# Patient Record
Sex: Male | Born: 1955 | Race: Black or African American | Hispanic: No | Marital: Single | Smoking: Former smoker
Health system: Southern US, Community
[De-identification: ages and names within clinical notes are randomized; demographics above are authoritative.]

## PROBLEM LIST (undated history)

## (undated) DIAGNOSIS — I10 Essential (primary) hypertension: Secondary | ICD-10-CM

## (undated) DIAGNOSIS — I219 Acute myocardial infarction, unspecified: Secondary | ICD-10-CM

## (undated) DIAGNOSIS — E785 Hyperlipidemia, unspecified: Secondary | ICD-10-CM

## (undated) HISTORY — PX: CORONARY ANGIOPLASTY WITH STENT PLACEMENT: SHX49

## (undated) HISTORY — PX: FEMORAL ARTERY STENT: SHX1583

---

## 2019-12-21 ENCOUNTER — Ambulatory Visit: Payer: Self-pay | Attending: Internal Medicine

## 2019-12-21 DIAGNOSIS — Z23 Encounter for immunization: Secondary | ICD-10-CM

## 2019-12-21 NOTE — Progress Notes (Signed)
   Covid-19 Vaccination Clinic  Name:  Craig Potter    MRN: [id number] DOB: [birth year]  12/21/2019  [name] was observed post Covid-19 immunization for 15 minutes without incident. He was provided with Vaccine Information Sheet and instruction to access the V-Safe system.   [name] was instructed to call 911 with any severe reactions post vaccine: [name] Difficulty breathing  . Swelling of face and throat  . A fast heartbeat  . A bad rash all over body  . Dizziness and weakness   Immunizations Administered    Name Date Dose VIS Date Route   Pfizer COVID-19 Vaccine 12/21/2019  4:37 PM 0.3 mL 09/14/2019 Intramuscular   Manufacturer: [employer], [employer]   Lot: [id number]   NDC: [address]-6

## 2020-01-11 ENCOUNTER — Ambulatory Visit: Payer: Self-pay | Attending: Internal Medicine

## 2020-01-11 DIAGNOSIS — Z23 Encounter for immunization: Secondary | ICD-10-CM

## 2020-01-11 NOTE — Progress Notes (Signed)
   Covid-19 Vaccination Clinic  Name:  Kenny Rea    MRN: [id number] DOB: 16-Oct-1955  01/11/2020  Mr. Babel was observed post Covid-19 immunization for 15 minutes without incident. He was provided with Vaccine Information Sheet and instruction to access the V-Safe system.   Mr. Grape was instructed to call 911 with any severe reactions post vaccine: [name] Difficulty breathing  . Swelling of face and throat  . A fast heartbeat  . A bad rash all over body  . Dizziness and weakness   Immunizations Administered    Name Date Dose VIS Date Route   Pfizer COVID-19 Vaccine 01/11/2020  3:01 PM 0.3 mL 09/14/2019 Intramuscular   Manufacturer: [employer], [employer]   Lot: [id number]   NDC: [address]-3

## 2021-03-11 DIAGNOSIS — I739 Peripheral vascular disease, unspecified: Secondary | ICD-10-CM | POA: Diagnosis not present

## 2021-03-11 DIAGNOSIS — Z1211 Encounter for screening for malignant neoplasm of colon: Secondary | ICD-10-CM | POA: Diagnosis not present

## 2021-04-22 DIAGNOSIS — I10 Essential (primary) hypertension: Secondary | ICD-10-CM | POA: Diagnosis not present

## 2021-04-22 DIAGNOSIS — F418 Other specified anxiety disorders: Secondary | ICD-10-CM | POA: Diagnosis not present

## 2021-04-22 DIAGNOSIS — E785 Hyperlipidemia, unspecified: Secondary | ICD-10-CM | POA: Diagnosis not present

## 2021-04-22 DIAGNOSIS — I2510 Atherosclerotic heart disease of native coronary artery without angina pectoris: Secondary | ICD-10-CM | POA: Diagnosis not present

## 2021-04-22 DIAGNOSIS — D751 Secondary polycythemia: Secondary | ICD-10-CM | POA: Diagnosis not present

## 2021-04-22 DIAGNOSIS — M542 Cervicalgia: Secondary | ICD-10-CM | POA: Diagnosis not present

## 2021-04-22 DIAGNOSIS — F17210 Nicotine dependence, cigarettes, uncomplicated: Secondary | ICD-10-CM | POA: Diagnosis not present

## 2021-04-28 DIAGNOSIS — D751 Secondary polycythemia: Secondary | ICD-10-CM | POA: Diagnosis not present

## 2021-04-28 DIAGNOSIS — E785 Hyperlipidemia, unspecified: Secondary | ICD-10-CM | POA: Diagnosis not present

## 2021-05-06 DIAGNOSIS — M542 Cervicalgia: Secondary | ICD-10-CM | POA: Diagnosis not present

## 2021-05-06 DIAGNOSIS — D751 Secondary polycythemia: Secondary | ICD-10-CM | POA: Diagnosis not present

## 2021-05-06 DIAGNOSIS — F17200 Nicotine dependence, unspecified, uncomplicated: Secondary | ICD-10-CM | POA: Diagnosis not present

## 2021-05-06 DIAGNOSIS — E785 Hyperlipidemia, unspecified: Secondary | ICD-10-CM | POA: Diagnosis not present

## 2021-05-06 DIAGNOSIS — I2510 Atherosclerotic heart disease of native coronary artery without angina pectoris: Secondary | ICD-10-CM | POA: Diagnosis not present

## 2021-05-06 DIAGNOSIS — I10 Essential (primary) hypertension: Secondary | ICD-10-CM | POA: Diagnosis not present

## 2021-05-06 DIAGNOSIS — F418 Other specified anxiety disorders: Secondary | ICD-10-CM | POA: Diagnosis not present

## 2021-06-09 ENCOUNTER — Encounter: Payer: Self-pay | Admitting: Internal Medicine

## 2021-06-10 ENCOUNTER — Ambulatory Visit: Admission: RE | Admit: 2021-06-10 | Payer: Medicare PPO | Source: Home / Self Care | Admitting: Internal Medicine

## 2021-06-10 ENCOUNTER — Encounter: Admission: RE | Payer: Self-pay | Source: Home / Self Care

## 2021-06-10 HISTORY — DX: Hyperlipidemia, unspecified: E78.5

## 2021-06-10 HISTORY — DX: Acute myocardial infarction, unspecified: I21.9

## 2021-06-10 HISTORY — DX: Essential (primary) hypertension: I10

## 2021-06-10 SURGERY — COLONOSCOPY WITH PROPOFOL
Anesthesia: General

## 2021-08-04 DIAGNOSIS — E785 Hyperlipidemia, unspecified: Secondary | ICD-10-CM | POA: Diagnosis not present

## 2021-08-07 ENCOUNTER — Other Ambulatory Visit: Payer: Self-pay | Admitting: Internal Medicine

## 2021-08-07 ENCOUNTER — Ambulatory Visit
Admission: RE | Admit: 2021-08-07 | Discharge: 2021-08-07 | Disposition: A | Discharge status: Home / Self Care | Payer: Medicare PPO | Attending: Internal Medicine | Admitting: Internal Medicine

## 2021-08-07 ENCOUNTER — Ambulatory Visit
Admission: RE | Admit: 2021-08-07 | Discharge: 2021-08-07 | Disposition: A | Discharge status: Home / Self Care | Payer: Medicare PPO | Source: Ambulatory Visit | Attending: Internal Medicine | Admitting: Internal Medicine

## 2021-08-07 DIAGNOSIS — F17200 Nicotine dependence, unspecified, uncomplicated: Secondary | ICD-10-CM | POA: Diagnosis not present

## 2021-08-07 DIAGNOSIS — E785 Hyperlipidemia, unspecified: Secondary | ICD-10-CM | POA: Diagnosis not present

## 2021-08-07 DIAGNOSIS — M5410 Radiculopathy, site unspecified: Secondary | ICD-10-CM | POA: Insufficient documentation

## 2021-08-07 DIAGNOSIS — M545 Low back pain, unspecified: Secondary | ICD-10-CM | POA: Diagnosis not present

## 2021-08-07 DIAGNOSIS — I10 Essential (primary) hypertension: Secondary | ICD-10-CM | POA: Diagnosis not present

## 2021-08-07 DIAGNOSIS — Z1331 Encounter for screening for depression: Secondary | ICD-10-CM | POA: Diagnosis not present

## 2021-08-07 DIAGNOSIS — F418 Other specified anxiety disorders: Secondary | ICD-10-CM | POA: Diagnosis not present

## 2021-08-07 DIAGNOSIS — R52 Pain, unspecified: Secondary | ICD-10-CM

## 2021-08-07 DIAGNOSIS — D751 Secondary polycythemia: Secondary | ICD-10-CM | POA: Diagnosis not present

## 2021-08-07 DIAGNOSIS — S335XXA Sprain of ligaments of lumbar spine, initial encounter: Secondary | ICD-10-CM | POA: Diagnosis not present

## 2021-08-07 DIAGNOSIS — M542 Cervicalgia: Secondary | ICD-10-CM | POA: Diagnosis not present

## 2021-08-07 DIAGNOSIS — I2510 Atherosclerotic heart disease of native coronary artery without angina pectoris: Secondary | ICD-10-CM | POA: Diagnosis not present

## 2021-09-07 DIAGNOSIS — I10 Essential (primary) hypertension: Secondary | ICD-10-CM | POA: Diagnosis not present

## 2021-09-07 DIAGNOSIS — Z0001 Encounter for general adult medical examination with abnormal findings: Secondary | ICD-10-CM | POA: Diagnosis not present

## 2021-09-08 DIAGNOSIS — B370 Candidal stomatitis: Secondary | ICD-10-CM | POA: Diagnosis not present

## 2021-09-08 DIAGNOSIS — M542 Cervicalgia: Secondary | ICD-10-CM | POA: Diagnosis not present

## 2021-09-08 DIAGNOSIS — Z0001 Encounter for general adult medical examination with abnormal findings: Secondary | ICD-10-CM | POA: Diagnosis not present

## 2021-09-08 DIAGNOSIS — I2510 Atherosclerotic heart disease of native coronary artery without angina pectoris: Secondary | ICD-10-CM | POA: Diagnosis not present

## 2021-09-08 DIAGNOSIS — E785 Hyperlipidemia, unspecified: Secondary | ICD-10-CM | POA: Diagnosis not present

## 2021-09-08 DIAGNOSIS — F17200 Nicotine dependence, unspecified, uncomplicated: Secondary | ICD-10-CM | POA: Diagnosis not present

## 2021-09-08 DIAGNOSIS — F418 Other specified anxiety disorders: Secondary | ICD-10-CM | POA: Diagnosis not present

## 2021-09-08 DIAGNOSIS — S335XXA Sprain of ligaments of lumbar spine, initial encounter: Secondary | ICD-10-CM | POA: Diagnosis not present

## 2021-09-08 DIAGNOSIS — I10 Essential (primary) hypertension: Secondary | ICD-10-CM | POA: Diagnosis not present

## 2021-09-08 DIAGNOSIS — D751 Secondary polycythemia: Secondary | ICD-10-CM | POA: Diagnosis not present

## 2021-09-18 DIAGNOSIS — Z23 Encounter for immunization: Secondary | ICD-10-CM | POA: Diagnosis not present

## 2021-10-07 ENCOUNTER — Other Ambulatory Visit: Payer: Self-pay

## 2021-10-07 DIAGNOSIS — F17210 Nicotine dependence, cigarettes, uncomplicated: Secondary | ICD-10-CM

## 2021-10-07 DIAGNOSIS — Z87891 Personal history of nicotine dependence: Secondary | ICD-10-CM

## 2021-10-19 NOTE — Progress Notes (Addendum)
Virtual Visit via Telephone Note  I connected with Craig Potter on 10/20/21 at 10:30 AM EST by telephone and verified that I am speaking with the correct person using two identifiers.  Location: Patient: Home Provider: Home   I discussed the limitations, risks, security and privacy concerns of performing an evaluation and management service by telephone and the availability of in person appointments. I also discussed with the patient that there may be a patient responsible charge related to this service. The patient expressed understanding and agreed to proceed.  Glenford Bayley, NP   Shared Decision Making Visit Lung Cancer Screening Program [phone])   Eligibility: Age 66 y.o. Pack Years Smoking History Calculation 46 (# packs/per year x # years smoked) Recent History of coughing up blood  no Unexplained weight loss? no ( >Than 15 pounds within the last 6 months ) Prior History Lung / other cancer no (Diagnosis within the last 5 years already requiring surveillance chest CT Scans). Smoking Status Current Smoker Former Smokers: Years since quit: NA  Quit Date: NA  Visit Components: Discussion included one or more decision making aids. yes Discussion included risk/benefits of screening. yes Discussion included potential follow up diagnostic testing for abnormal scans. yes Discussion included meaning and risk of over diagnosis. yes Discussion included meaning and risk of False Positives. yes Discussion included meaning of total radiation exposure. yes  Counseling Included: Importance of adherence to annual lung cancer LDCT screening. yes Impact of comorbidities on ability to participate in the program. yes Ability and willingness to under diagnostic treatment. yes  Smoking Cessation Counseling: Current Smokers:  Discussed importance of smoking cessation. yes Information about tobacco cessation classes and interventions provided to patient. yes Patient provided with  "ticket" for LDCT Scan. NA Symptomatic Patient. no  Counseling(Intermediate counseling: > three minutes) 99406 Diagnosis Code: Tobacco Use Z72.0 Asymptomatic Patient yes  Counseling (Intermediate counseling: > three minutes counseling) D3220 Former Smokers:  Discussed the importance of maintaining cigarette abstinence. yes Diagnosis Code: Personal History of Nicotine Dependence. U54.270 Information about tobacco cessation classes and interventions provided to patient. Yes Patient provided with "ticket" for LDCT Scan. NA Written Order for Lung Cancer Screening with LDCT placed in Epic. Yes (CT Chest Lung Cancer Screening Low Dose W/O CM) [id number] Z12.2-Screening of respiratory organs Z87.891-Personal history of nicotine dependence  I have spent 25 minutes of face to face/ virtual visit   time with Craig Potter discussing the risks and benefits of lung cancer screening. We viewed / discussed a power point together that explained in detail the above noted topics. We paused at intervals to allow for questions to be asked and answered to ensure understanding.We discussed that the single most powerful action that he can take to decrease his risk of developing lung cancer is to quit smoking. We discussed whether or not he is ready to commit to setting a quit date. We discussed options for tools to aid in quitting smoking including nicotine replacement therapy, non-nicotine medications, support groups, Quit Smart classes, and behavior modification. We discussed that often times setting smaller, more achievable goals, such as eliminating 1 cigarette a day for a week and then 2 cigarettes a day for a week can be helpful in slowly decreasing the number of cigarettes smoked. This allows for a sense of accomplishment as well as providing a clinical benefit. I provided  him  with smoking cessation  information  with contact information for community resources, classes, free nicotine replacement therapy, and access to  mobile apps,  text messaging, and on-line smoking cessation help. I have also provided  him  the office contact information in the event he needs to contact me, or the screening staff. We discussed the time and location of the scan, and that either Abigail Miyamoto RN, Karlton Lemon, RN  or I will call / send a letter with the results within 24-72 hours of receiving them. The patient verbalized understanding of all of  the above and had no further questions upon leaving the office. They have my contact information in the event they have any further questions.  I spent 3-5 minutes counseling on smoking cessation and the health risks of continued tobacco abuse.  I explained to the patient that there has been a high incidence of coronary artery disease noted on these exams. I explained that this is a non-gated exam therefore degree or severity cannot be determined. This patient is on statin therapy. I have asked the patient to follow-up with their PCP regarding any incidental finding of coronary artery disease and management with diet or medication as their PCP  feels is clinically indicated. The patient verbalized understanding of the above and had no further questions upon completion of the visit.   Glenford Bayley, NP

## 2021-10-19 NOTE — Patient Instructions (Signed)
Thank you for participating in the [facility] Lung Cancer Screening Program. °It was our pleasure to meet you today. °We will call you with the results of your scan within the next few days. °Your scan will be assigned a Lung RADS category score by the physicians reading the scans.  °This Lung RADS score determines follow up scanning.  °See below for description of categories, and follow up screening recommendations. °We will be in touch to schedule your follow up screening annually or based on recommendations of our providers. °We will fax a copy of your scan results to your Primary Care Physician, or the physician who referred you to the program, to ensure they have the results. °Please call the office if you have any questions or concerns regarding your scanning experience or results.  °Our office number is [phone]. °Please speak with Denise Phelps, RN. She is our Lung Cancer Screening RN. °If she is unavailable when you call, please have the office staff send her a message. She will return your call at her earliest convenience. °Remember, if your scan is normal, we will scan you annually as long as you continue to meet the criteria for the program. (Age 55-77, Current smoker or smoker who has quit within the last 15 years). °If you are a smoker, remember, quitting is the single most powerful action that you can take to decrease your risk of lung cancer and other pulmonary, breathing related problems. °We know quitting is hard, and we are here to help.  °Please let us know if there is anything we can do to help you meet your goal of quitting. °If you are a former smoker, congratulations. We are proud of you! Remain smoke free! °Remember you can refer friends or family members through the number above.  °We will screen them to make sure they meet criteria for the program. °Thank you for helping us take better care of you by participating in Lung Screening. ° °You can receive free nicotine replacement therapy  ( patches, gum or mints) by calling 1-800-QUIT NOW. Please call so we can get you on the path to becoming  a non-smoker. I know it is hard, but you can do this! ° °Lung RADS Categories: ° °Lung RADS 1: no nodules or definitely non-concerning nodules.  °Recommendation is for a repeat annual scan in 12 months. ° °Lung RADS 2:  nodules that are non-concerning in appearance and behavior with a very low likelihood of becoming an active cancer. °Recommendation is for a repeat annual scan in 12 months. ° °Lung RADS 3: nodules that are probably non-concerning , includes nodules with a low likelihood of becoming an active cancer.  Recommendation is for a 6-month repeat screening scan. Often noted after an upper respiratory illness. We will be in touch to make sure you have no questions, and to schedule your 6-month scan. ° °Lung RADS 4 A: nodules with concerning findings, recommendation is most often for a follow up scan in 3 months or additional testing based on our provider's assessment of the scan. We will be in touch to make sure you have no questions and to schedule the recommended 3 month follow up scan. ° °Lung RADS 4 B:  indicates findings that are concerning. We will be in touch with you to schedule additional diagnostic testing based on our provider's  assessment of the scan. ° °Hypnosis for smoking cessation  °[employer] °[phone] ° °Acupuncture for smoking cessation  °[facility] °[phone]  °

## 2021-10-20 ENCOUNTER — Other Ambulatory Visit: Payer: Self-pay

## 2021-10-20 ENCOUNTER — Ambulatory Visit (INDEPENDENT_AMBULATORY_CARE_PROVIDER_SITE_OTHER): Payer: Medicare HMO | Admitting: Primary Care

## 2021-10-20 DIAGNOSIS — F17210 Nicotine dependence, cigarettes, uncomplicated: Secondary | ICD-10-CM

## 2021-10-20 DIAGNOSIS — F17200 Nicotine dependence, unspecified, uncomplicated: Secondary | ICD-10-CM

## 2021-10-21 ENCOUNTER — Other Ambulatory Visit: Payer: Self-pay

## 2021-10-21 ENCOUNTER — Ambulatory Visit
Admission: RE | Admit: 2021-10-21 | Discharge: 2021-10-21 | Disposition: A | Discharge status: Home / Self Care | Payer: Medicare HMO | Source: Ambulatory Visit | Attending: Acute Care | Admitting: Acute Care

## 2021-10-21 DIAGNOSIS — F17210 Nicotine dependence, cigarettes, uncomplicated: Secondary | ICD-10-CM | POA: Diagnosis not present

## 2021-10-21 DIAGNOSIS — Z87891 Personal history of nicotine dependence: Secondary | ICD-10-CM | POA: Diagnosis not present

## 2021-10-22 ENCOUNTER — Other Ambulatory Visit: Payer: Self-pay

## 2021-10-22 DIAGNOSIS — Z87891 Personal history of nicotine dependence: Secondary | ICD-10-CM

## 2021-10-22 DIAGNOSIS — F17210 Nicotine dependence, cigarettes, uncomplicated: Secondary | ICD-10-CM

## 2021-12-04 DIAGNOSIS — E669 Obesity, unspecified: Secondary | ICD-10-CM | POA: Diagnosis not present

## 2021-12-04 DIAGNOSIS — E782 Mixed hyperlipidemia: Secondary | ICD-10-CM | POA: Diagnosis not present

## 2021-12-04 DIAGNOSIS — I10 Essential (primary) hypertension: Secondary | ICD-10-CM | POA: Diagnosis not present

## 2021-12-04 DIAGNOSIS — Z0001 Encounter for general adult medical examination with abnormal findings: Secondary | ICD-10-CM | POA: Diagnosis not present

## 2021-12-07 DIAGNOSIS — B370 Candidal stomatitis: Secondary | ICD-10-CM | POA: Diagnosis not present

## 2021-12-07 DIAGNOSIS — F17210 Nicotine dependence, cigarettes, uncomplicated: Secondary | ICD-10-CM | POA: Diagnosis not present

## 2021-12-07 DIAGNOSIS — E785 Hyperlipidemia, unspecified: Secondary | ICD-10-CM | POA: Diagnosis not present

## 2021-12-07 DIAGNOSIS — D751 Secondary polycythemia: Secondary | ICD-10-CM | POA: Diagnosis not present

## 2021-12-07 DIAGNOSIS — M542 Cervicalgia: Secondary | ICD-10-CM | POA: Diagnosis not present

## 2021-12-07 DIAGNOSIS — R7303 Prediabetes: Secondary | ICD-10-CM | POA: Diagnosis not present

## 2021-12-07 DIAGNOSIS — I2510 Atherosclerotic heart disease of native coronary artery without angina pectoris: Secondary | ICD-10-CM | POA: Diagnosis not present

## 2021-12-07 DIAGNOSIS — I10 Essential (primary) hypertension: Secondary | ICD-10-CM | POA: Diagnosis not present

## 2021-12-07 DIAGNOSIS — S335XXA Sprain of ligaments of lumbar spine, initial encounter: Secondary | ICD-10-CM | POA: Diagnosis not present

## 2022-04-02 DIAGNOSIS — D751 Secondary polycythemia: Secondary | ICD-10-CM | POA: Diagnosis not present

## 2022-04-02 DIAGNOSIS — E785 Hyperlipidemia, unspecified: Secondary | ICD-10-CM | POA: Diagnosis not present

## 2022-04-07 ENCOUNTER — Emergency Department
Admission: EM | Admit: 2022-04-07 | Discharge: 2022-04-07 | Disposition: A | Discharge status: Home / Self Care | Payer: Medicare HMO | Attending: Emergency Medicine | Admitting: Emergency Medicine

## 2022-04-07 ENCOUNTER — Other Ambulatory Visit: Payer: Self-pay

## 2022-04-07 ENCOUNTER — Emergency Department: Payer: Medicare HMO

## 2022-04-07 ENCOUNTER — Encounter: Payer: Self-pay | Admitting: Emergency Medicine

## 2022-04-07 DIAGNOSIS — N3000 Acute cystitis without hematuria: Secondary | ICD-10-CM | POA: Diagnosis not present

## 2022-04-07 DIAGNOSIS — I2510 Atherosclerotic heart disease of native coronary artery without angina pectoris: Secondary | ICD-10-CM | POA: Diagnosis not present

## 2022-04-07 DIAGNOSIS — R0602 Shortness of breath: Secondary | ICD-10-CM | POA: Diagnosis not present

## 2022-04-07 DIAGNOSIS — I10 Essential (primary) hypertension: Secondary | ICD-10-CM | POA: Diagnosis not present

## 2022-04-07 DIAGNOSIS — R531 Weakness: Secondary | ICD-10-CM | POA: Diagnosis not present

## 2022-04-07 DIAGNOSIS — R112 Nausea with vomiting, unspecified: Secondary | ICD-10-CM

## 2022-04-07 LAB — CBC WITH DIFFERENTIAL/PLATELET
Abs Immature Granulocytes: 0.05 10*3/uL (ref 0.00–0.07)
Basophils Absolute: 0 10*3/uL (ref 0.0–0.1)
Basophils Relative: 0 %
Eosinophils Absolute: 0 10*3/uL (ref 0.0–0.5)
Eosinophils Relative: 0 %
HCT: 54.2 % — ABNORMAL HIGH (ref 39.0–52.0)
Hemoglobin: 17.7 g/dL — ABNORMAL HIGH (ref 13.0–17.0)
Immature Granulocytes: 0 %
Lymphocytes Relative: 9 %
Lymphs Abs: 1.4 10*3/uL (ref 0.7–4.0)
MCH: 28.4 pg (ref 26.0–34.0)
MCHC: 32.7 g/dL (ref 30.0–36.0)
MCV: 86.9 fL (ref 80.0–100.0)
Monocytes Absolute: 1.5 10*3/uL — ABNORMAL HIGH (ref 0.1–1.0)
Monocytes Relative: 10 %
Neutro Abs: 11.8 10*3/uL — ABNORMAL HIGH (ref 1.7–7.7)
Neutrophils Relative %: 81 %
Platelets: 204 10*3/uL (ref 150–400)
RBC: 6.24 MIL/uL — ABNORMAL HIGH (ref 4.22–5.81)
RDW: 13.7 % (ref 11.5–15.5)
WBC: 14.7 10*3/uL — ABNORMAL HIGH (ref 4.0–10.5)
nRBC: 0 % (ref 0.0–0.2)

## 2022-04-07 LAB — URINALYSIS, ROUTINE W REFLEX MICROSCOPIC
Bacteria, UA: NONE SEEN
Bilirubin Urine: NEGATIVE
Glucose, UA: NEGATIVE mg/dL
Ketones, ur: NEGATIVE mg/dL
Nitrite: NEGATIVE
Protein, ur: 100 mg/dL — AB
Specific Gravity, Urine: 1.026 (ref 1.005–1.030)
pH: 5 (ref 5.0–8.0)

## 2022-04-07 LAB — COMPREHENSIVE METABOLIC PANEL
ALT: 22 U/L (ref 0–44)
AST: 21 U/L (ref 15–41)
Albumin: 4.2 g/dL (ref 3.5–5.0)
Alkaline Phosphatase: 88 U/L (ref 38–126)
Anion gap: 10 (ref 5–15)
BUN: 20 mg/dL (ref 8–23)
CO2: 24 mmol/L (ref 22–32)
Calcium: 9.3 mg/dL (ref 8.9–10.3)
Chloride: 99 mmol/L (ref 98–111)
Creatinine, Ser: 1.14 mg/dL (ref 0.61–1.24)
GFR, Estimated: >60 mL/min (ref >60)
Glucose, Bld: 127 mg/dL — ABNORMAL HIGH (ref 70–99)
Potassium: 3.7 mmol/L (ref 3.5–5.1)
Sodium: 133 mmol/L — ABNORMAL LOW (ref 135–145)
Total Bilirubin: 1.1 mg/dL (ref 0.3–1.2)
Total Protein: 8.2 g/dL — ABNORMAL HIGH (ref 6.5–8.1)

## 2022-04-07 LAB — TROPONIN I (HIGH SENSITIVITY): Troponin I (High Sensitivity): 12 ng/L (ref <18)

## 2022-04-07 LAB — LIPASE, BLOOD: Lipase: 24 U/L (ref 11–51)

## 2022-04-07 MED ORDER — LACTATED RINGERS IV BOLUS
1000.0000 mL | Freq: Once | INTRAVENOUS | Status: AC
  Administered 2022-04-07: 1000 mL via INTRAVENOUS

## 2022-04-07 MED ORDER — ONDANSETRON 4 MG PO TBDP: 4.0000 mg | ORAL_TABLET | Freq: Three times a day (TID) | ORAL | 0 refills | Status: DC | PRN

## 2022-04-07 MED ORDER — SODIUM CHLORIDE 0.9 % IV SOLN
1.0000 g | Freq: Once | INTRAVENOUS | Status: AC
  Administered 2022-04-07: 1 g via INTRAVENOUS
  Filled 2022-04-07: qty 10

## 2022-04-07 MED ORDER — CEPHALEXIN 500 MG PO CAPS: 500.0000 mg | ORAL_CAPSULE | Freq: Four times a day (QID) | ORAL | 0 refills | Status: AC

## 2022-04-07 MED ORDER — ONDANSETRON HCL 4 MG/2ML IJ SOLN
INTRAMUSCULAR | Status: AC
  Administered 2022-04-07: 4 mg
  Filled 2022-04-07: qty 2

## 2022-04-07 NOTE — ED Notes (Signed)
Pt standing in room vomiting/ dry heaving into trash can, md notified, iv started meds given.

## 2022-04-07 NOTE — ED Notes (Signed)
Pt in bed, pt reports nausea and vomiting for the past three days. Resps even and unlabored, abd soft with bowel sounds.

## 2022-04-07 NOTE — ED Triage Notes (Signed)
Patient ambulatory to triage with steady gait, without difficulty or distress noted; pt reports nausea and fatigue x 2 days

## 2022-04-07 NOTE — ED Notes (Signed)
Second urine sent to lab for a urine culture.

## 2022-04-07 NOTE — ED Provider Notes (Signed)
[facility] Provider Note    Event Date/Time   First MD Initiated Contact with Patient 04/07/22 [phone]     (approximate)   History   Chief Complaint Weakness   HPI  Craig WILCZYNSKI Sr. is a 66 y.o. male with past medical history of hypertension, hyperlipidemia, and CAD who presents to the ED complaining of weakness.  Patient reports that for the past 2 to 3 days he has been feeling generally weaker than usual with nausea and occasional vomiting.  He denies any associated abdominal pain or flank pain, has not had any diarrhea.  He denies any dysuria or flank pain, has not noticed any blood in his urine or stool.  He does state it has been difficult for him to keep down either liquids or solids, but he is not aware of any sick contacts.  He reports some dull aching pain in the center of his chest that has been constant for the past 2 days, denies any cough or shortness of breath.     Physical Exam   Triage Vital Signs: ED Triage Vitals [04/07/22 0635]  Enc Vitals Group     BP 117/68     Pulse Rate 77     Resp 18     Temp 97.6 F (36.4 C)     Temp Source Oral     SpO2 95 %     Weight 198 lb (89.8 kg)     Height 5\' 11"  (1.803 m)     Head Circumference      Peak Flow      Pain Score 0     Pain Loc      Pain Edu?      Excl. in GC?     Most recent vital signs: Vitals:   04/07/22 0635 04/07/22 0923  BP: 117/68 (!) 133/99  Pulse: 77 (!) 103  Resp: 18 18  Temp: 97.6 F (36.4 C) (!) 97.4 F (36.3 C)  SpO2: 95% 98%    Constitutional: Alert and oriented. Eyes: Conjunctivae are normal. Head: Atraumatic. Nose: No congestion/rhinnorhea. Mouth/Throat: Mucous membranes are moist.  Cardiovascular: Normal rate, regular rhythm. Grossly normal heart sounds.  2+ radial pulses bilaterally. Respiratory: Normal respiratory effort.  No retractions. Lungs CTAB. Gastrointestinal: Soft and nontender.  No CVA tenderness bilaterally.  No  distention. Musculoskeletal: No lower extremity tenderness nor edema.  Neurologic:  Normal speech and language. No gross focal neurologic deficits are appreciated.    ED Results / Procedures / Treatments   Labs (all labs ordered are listed, but only abnormal results are displayed) Labs Reviewed  CBC WITH DIFFERENTIAL/PLATELET - Abnormal; Notable for the following components:      Result Value   WBC 14.7 (*)    RBC 6.24 (*)    Hemoglobin 17.7 (*)    HCT 54.2 (*)    Neutro Abs 11.8 (*)    Monocytes Absolute 1.5 (*)    All other components within normal limits  COMPREHENSIVE METABOLIC PANEL - Abnormal; Notable for the following components:   Sodium 133 (*)    Glucose, Bld 127 (*)    Total Protein 8.2 (*)    All other components within normal limits  URINALYSIS, ROUTINE W REFLEX MICROSCOPIC - Abnormal; Notable for the following components:   Color, Urine YELLOW (*)    APPearance HAZY (*)    Hgb urine dipstick SMALL (*)    Protein, ur 100 (*)    Leukocytes,Ua SMALL (*)    All  other components within normal limits  URINE CULTURE  LIPASE, BLOOD  TROPONIN I (HIGH SENSITIVITY)     EKG  ED ECG REPORT I, Chesley Noon, the attending physician, personally viewed and interpreted this ECG.   Date: 04/07/2022  EKG Time: 6:40  Rate: 74  Rhythm: normal sinus rhythm, PAC's noted, occasional PVC noted, unifocal  Axis: Normal  Intervals:none  ST&T Change: None  RADIOLOGY Chest x-ray reviewed and interpreted by me with no infiltrate, edema, or effusion.  PROCEDURES:  Critical Care performed: No  Procedures   MEDICATIONS ORDERED IN ED: Medications  ondansetron (ZOFRAN) 4 MG/2ML injection (4 mg  Given 04/07/22 1008)  lactated ringers bolus 1,000 mL (1,000 mLs Intravenous New Bag/Given 04/07/22 1009)  cefTRIAXone (ROCEPHIN) 1 g in sodium chloride 0.9 % 100 mL IVPB (1 g Intravenous New Bag/Given 04/07/22 1033)     IMPRESSION / MDM / ASSESSMENT AND PLAN / ED COURSE  I reviewed  the triage vital signs and the nursing notes.                              66 y.o. male with past medical history of hypertension, hyperlipidemia, and CAD who presents to the ED with generalized weakness for the past 2 to 3 days with nausea and occasional episodes of vomiting.  Patient's presentation is most consistent with acute presentation with potential threat to life or bodily function.  Differential diagnosis includes, but is not limited to, ACS, PE, pneumonia, dehydration, AKI, electrolyte abnormality, anemia, UTI.  Patient nontoxic-appearing and in no acute distress, vital signs are reassuring and do not appear concerning for sepsis.  He has a completely benign abdominal exam with no CVA tenderness bilaterally.  He does appear slightly dehydrated from recent vomiting and CBC appears consistent with hemoconcentration.  No significant AKI or electrolyte abnormality noted, LFTs and lipase are within normal limits.  We will hydrate with IV fluids and treat with IV Zofran given ongoing nausea despite ODT Zofran.  EKG shows PACs and PVCs but no ischemic changes and troponin within normal limits, doubt ACS or PE.  Suspect his discomfort in his chest is due to frequent vomiting.  Urinalysis does appear concerning for UTI and we will send for culture, start patient on Rocephin.  Chest x-ray is unremarkable, patient states that his nausea is improved following IV fluids and Zofran, but he continues to feel weaker than usual.  This is likely secondary to UTI, given otherwise reassuring work-up, patient would be appropriate for outpatient management.  He was given a dose of Rocephin and we will start on Keflex, he was also prescribed Zofran for use as needed.  He was counseled to follow-up with his PCP and to return to the ED for new worsening symptoms, patient agrees with plan.      FINAL CLINICAL IMPRESSION(S) / ED DIAGNOSES   Final diagnoses:  Generalized weakness  Nausea and vomiting,  unspecified vomiting type  Acute cystitis without hematuria     Rx / DC Orders   ED Discharge Orders          Ordered    ondansetron (ZOFRAN-ODT) 4 MG disintegrating tablet  Every 8 hours PRN        04/07/22 1231    cephALEXin (KEFLEX) 500 MG capsule  4 times daily        04/07/22 1231             Note:  This document was  prepared using Conservation officer, historic buildings and may include unintentional dictation errors.   Chesley Noon, MD 04/07/22 1235

## 2022-04-08 LAB — URINE CULTURE

## 2022-04-14 ENCOUNTER — Ambulatory Visit: Admission: RE | Admit: 2022-04-14 | Payer: Medicare HMO | Source: Home / Self Care | Admitting: Internal Medicine

## 2022-04-14 ENCOUNTER — Encounter: Admission: RE | Payer: Self-pay | Source: Home / Self Care

## 2022-04-14 SURGERY — COLONOSCOPY WITH PROPOFOL
Anesthesia: General

## 2022-04-16 ENCOUNTER — Ambulatory Visit: Payer: Self-pay | Admitting: Nurse Practitioner

## 2022-04-16 DIAGNOSIS — Z113 Encounter for screening for infections with a predominantly sexual mode of transmission: Secondary | ICD-10-CM

## 2022-04-16 NOTE — Progress Notes (Unsigned)
Pt went to the ED on 7/5 for N/V and weakness.  He stated that he was told he had an STD and showed me a bottle of Keflex that had been prescribed, but has not been taking.  I reviewed the ED records with the patient and informed him that they were checking him for a UTI and other possible issues, not an STD.   Pt states that he has not had sex for 5 years, has no discharge or any sores on or around his pelvic region.  Patient informed that he needed to take his Keflex and if he had any additional problems, he needed to see his PCP, per A. White FNP.

## 2022-04-19 DIAGNOSIS — R7303 Prediabetes: Secondary | ICD-10-CM | POA: Diagnosis not present

## 2022-04-19 DIAGNOSIS — S335XXA Sprain of ligaments of lumbar spine, initial encounter: Secondary | ICD-10-CM | POA: Diagnosis not present

## 2022-04-19 DIAGNOSIS — I10 Essential (primary) hypertension: Secondary | ICD-10-CM | POA: Diagnosis not present

## 2022-04-19 DIAGNOSIS — F418 Other specified anxiety disorders: Secondary | ICD-10-CM | POA: Diagnosis not present

## 2022-04-19 DIAGNOSIS — F17200 Nicotine dependence, unspecified, uncomplicated: Secondary | ICD-10-CM | POA: Diagnosis not present

## 2022-04-19 DIAGNOSIS — B370 Candidal stomatitis: Secondary | ICD-10-CM | POA: Diagnosis not present

## 2022-04-19 DIAGNOSIS — E785 Hyperlipidemia, unspecified: Secondary | ICD-10-CM | POA: Diagnosis not present

## 2022-04-19 DIAGNOSIS — M542 Cervicalgia: Secondary | ICD-10-CM | POA: Diagnosis not present

## 2022-04-19 DIAGNOSIS — D751 Secondary polycythemia: Secondary | ICD-10-CM | POA: Diagnosis not present

## 2022-04-21 ENCOUNTER — Encounter: Payer: Self-pay | Admitting: Emergency Medicine

## 2022-04-21 ENCOUNTER — Emergency Department: Payer: Medicare HMO

## 2022-04-21 ENCOUNTER — Emergency Department
Admission: EM | Admit: 2022-04-21 | Discharge: 2022-04-21 | Disposition: A | Discharge status: Home / Self Care | Payer: Medicare HMO | Attending: Emergency Medicine | Admitting: Emergency Medicine

## 2022-04-21 DIAGNOSIS — Z7982 Long term (current) use of aspirin: Secondary | ICD-10-CM | POA: Diagnosis not present

## 2022-04-21 DIAGNOSIS — R079 Chest pain, unspecified: Secondary | ICD-10-CM | POA: Diagnosis not present

## 2022-04-21 DIAGNOSIS — Z20822 Contact with and (suspected) exposure to covid-19: Secondary | ICD-10-CM | POA: Diagnosis not present

## 2022-04-21 DIAGNOSIS — R1032 Left lower quadrant pain: Secondary | ICD-10-CM | POA: Diagnosis not present

## 2022-04-21 DIAGNOSIS — I10 Essential (primary) hypertension: Secondary | ICD-10-CM | POA: Diagnosis not present

## 2022-04-21 DIAGNOSIS — Z79899 Other long term (current) drug therapy: Secondary | ICD-10-CM | POA: Diagnosis not present

## 2022-04-21 DIAGNOSIS — Z955 Presence of coronary angioplasty implant and graft: Secondary | ICD-10-CM | POA: Insufficient documentation

## 2022-04-21 DIAGNOSIS — D72829 Elevated white blood cell count, unspecified: Secondary | ICD-10-CM | POA: Diagnosis not present

## 2022-04-21 DIAGNOSIS — I2510 Atherosclerotic heart disease of native coronary artery without angina pectoris: Secondary | ICD-10-CM | POA: Insufficient documentation

## 2022-04-21 DIAGNOSIS — R112 Nausea with vomiting, unspecified: Secondary | ICD-10-CM | POA: Diagnosis not present

## 2022-04-21 DIAGNOSIS — R300 Dysuria: Secondary | ICD-10-CM

## 2022-04-21 DIAGNOSIS — R531 Weakness: Secondary | ICD-10-CM | POA: Diagnosis not present

## 2022-04-21 DIAGNOSIS — R109 Unspecified abdominal pain: Secondary | ICD-10-CM | POA: Diagnosis not present

## 2022-04-21 DIAGNOSIS — N179 Acute kidney failure, unspecified: Secondary | ICD-10-CM | POA: Insufficient documentation

## 2022-04-21 DIAGNOSIS — R1030 Lower abdominal pain, unspecified: Secondary | ICD-10-CM | POA: Diagnosis not present

## 2022-04-21 LAB — LIPASE, BLOOD: Lipase: 23 U/L (ref 11–51)

## 2022-04-21 LAB — CBC
HCT: 48.8 % (ref 39.0–52.0)
Hemoglobin: 16 g/dL (ref 13.0–17.0)
MCH: 28 pg (ref 26.0–34.0)
MCHC: 32.8 g/dL (ref 30.0–36.0)
MCV: 85.5 fL (ref 80.0–100.0)
Platelets: 209 10*3/uL (ref 150–400)
RBC: 5.71 MIL/uL (ref 4.22–5.81)
RDW: 13.2 % (ref 11.5–15.5)
WBC: 23.3 10*3/uL — ABNORMAL HIGH (ref 4.0–10.5)
nRBC: 0 % (ref 0.0–0.2)

## 2022-04-21 LAB — URINALYSIS, ROUTINE W REFLEX MICROSCOPIC
Bacteria, UA: NONE SEEN
Bilirubin Urine: NEGATIVE
Glucose, UA: NEGATIVE mg/dL
Ketones, ur: NEGATIVE mg/dL
Nitrite: NEGATIVE
Protein, ur: NEGATIVE mg/dL
Specific Gravity, Urine: 1.012 (ref 1.005–1.030)
pH: 5 (ref 5.0–8.0)

## 2022-04-21 LAB — RESP PANEL BY RT-PCR (FLU A&B, COVID) ARPGX2
Influenza A by PCR: NEGATIVE
Influenza B by PCR: NEGATIVE
SARS Coronavirus 2 by RT PCR: NEGATIVE

## 2022-04-21 LAB — TROPONIN I (HIGH SENSITIVITY)
Troponin I (High Sensitivity): 10 ng/L (ref <18)
Troponin I (High Sensitivity): 12 ng/L (ref <18)

## 2022-04-21 LAB — COMPREHENSIVE METABOLIC PANEL
ALT: 22 U/L (ref 0–44)
AST: 19 U/L (ref 15–41)
Albumin: 3.6 g/dL (ref 3.5–5.0)
Alkaline Phosphatase: 89 U/L (ref 38–126)
Anion gap: 11 (ref 5–15)
BUN: 23 mg/dL (ref 8–23)
CO2: 27 mmol/L (ref 22–32)
Calcium: 8.8 mg/dL — ABNORMAL LOW (ref 8.9–10.3)
Chloride: 93 mmol/L — ABNORMAL LOW (ref 98–111)
Creatinine, Ser: 1.46 mg/dL — ABNORMAL HIGH (ref 0.61–1.24)
GFR, Estimated: 53 mL/min — ABNORMAL LOW (ref >60)
Glucose, Bld: 117 mg/dL — ABNORMAL HIGH (ref 70–99)
Potassium: 3.7 mmol/L (ref 3.5–5.1)
Sodium: 131 mmol/L — ABNORMAL LOW (ref 135–145)
Total Bilirubin: 0.9 mg/dL (ref 0.3–1.2)
Total Protein: 7.7 g/dL (ref 6.5–8.1)

## 2022-04-21 LAB — CHLAMYDIA/NGC RT PCR (ARMC ONLY)
Chlamydia Tr: NOT DETECTED
N gonorrhoeae: NOT DETECTED

## 2022-04-21 MED ORDER — ONDANSETRON 4 MG PO TBDP
4.0000 mg | ORAL_TABLET | Freq: Once | ORAL | Status: AC | PRN
  Administered 2022-04-21: 4 mg via ORAL
  Filled 2022-04-21: qty 1

## 2022-04-21 MED ORDER — DICYCLOMINE HCL 10 MG PO CAPS
10.0000 mg | ORAL_CAPSULE | Freq: Once | ORAL | Status: AC
  Administered 2022-04-21: 10 mg via ORAL
  Filled 2022-04-21: qty 1

## 2022-04-21 MED ORDER — SODIUM CHLORIDE 0.9 % IV SOLN
1.0000 g | Freq: Once | INTRAVENOUS | Status: AC
  Administered 2022-04-21: 1 g via INTRAVENOUS
  Filled 2022-04-21: qty 10

## 2022-04-21 MED ORDER — METOCLOPRAMIDE HCL 5 MG/ML IJ SOLN
10.0000 mg | Freq: Once | INTRAMUSCULAR | Status: AC
Start: 2022-04-21 — End: 2022-04-21
  Administered 2022-04-21: 10 mg via INTRAVENOUS
  Filled 2022-04-21: qty 2

## 2022-04-21 MED ORDER — ONDANSETRON 4 MG PO TBDP: 4.0000 mg | ORAL_TABLET | Freq: Three times a day (TID) | ORAL | 0 refills | Status: DC | PRN

## 2022-04-21 MED ORDER — IOHEXOL 300 MG/ML  SOLN
100.0000 mL | Freq: Once | INTRAMUSCULAR | Status: AC | PRN
  Administered 2022-04-21: 100 mL via INTRAVENOUS

## 2022-04-21 MED ORDER — CEPHALEXIN 500 MG PO CAPS: 500.0000 mg | ORAL_CAPSULE | Freq: Three times a day (TID) | ORAL | 0 refills | Status: AC

## 2022-04-21 MED ORDER — ONDANSETRON HCL 4 MG/2ML IJ SOLN
4.0000 mg | Freq: Once | INTRAMUSCULAR | Status: AC
  Administered 2022-04-21: 4 mg via INTRAVENOUS
  Filled 2022-04-21: qty 2

## 2022-04-21 MED ORDER — KETOROLAC TROMETHAMINE 30 MG/ML IJ SOLN: 15.0000 mg | Freq: Once | INTRAMUSCULAR | Status: DC

## 2022-04-21 MED ORDER — SODIUM CHLORIDE 0.9 % IV BOLUS (SEPSIS)
1000.0000 mL | Freq: Once | INTRAVENOUS | Status: AC
  Administered 2022-04-21: 1000 mL via INTRAVENOUS

## 2022-04-21 NOTE — ED Provider Notes (Signed)
[facility] Provider Note    Event Date/Time   First MD Initiated Contact with Patient 04/21/22 [phone]     (approximate)   History   Emesis   HPI  Craig CLAVETTE Sr. is a 66 y.o. male with history of hypertension, hyperlipidemia, CAD and peripheral artery disease status post coronary artery stent and femoral artery stent who presents to the emergency department complaints of lower abdominal pain for the past 2 weeks worse in the left lower abdomen.  Reports he has had nausea and vomiting.  States he has not had a bowel movement in 4 to 5 days but is passing gas.  Patient has a large laparotomy scar but is unable to tell me what this is from.  He continues to state this was from a "heart attack".  He denies any testicular pain or swelling.  No dysuria, penile discharge.  States his urine stream is "slow".   History provided by patient.    Past Medical History:  Diagnosis Date   HLD (hyperlipidemia)    Hypertension    Myocardial infarction [facility])     Past Surgical History:  Procedure Laterality Date   CORONARY ANGIOPLASTY WITH STENT PLACEMENT     FEMORAL ARTERY STENT      MEDICATIONS:  Prior to Admission medications   Medication Sig Start Date End Date Taking? Authorizing Provider  amLODipine (NORVASC) 10 MG tablet Take 10 mg by mouth daily.    [provider]  aspirin EC 81 MG tablet Take 81 mg by mouth daily. Swallow whole.    [provider]  carvedilol (COREG) 12.5 MG tablet Take 12.5 mg by mouth 2 (two) times daily with a meal.    [provider]  escitalopram (LEXAPRO) 10 MG tablet Take 10 mg by mouth daily.    [provider]  hydrochlorothiazide (HYDRODIURIL) 25 MG tablet Take 25 mg by mouth daily.    [provider]  meloxicam (MOBIC) 15 MG tablet Take 15 mg by mouth daily.    [provider]  Multiple Vitamin (MULTIVITAMIN) tablet Take 1 tablet by mouth daily.    [provider]   nitroGLYCERIN (NITROSTAT) 0.4 MG SL tablet Place 0.4 mg under the tongue every 5 (five) minutes as needed for chest pain.    [provider]  ondansetron (ZOFRAN-ODT) 4 MG disintegrating tablet Take 1 tablet (4 mg total) by mouth every 8 (eight) hours as needed for nausea or vomiting. 04/07/22   Blake Divine, MD  rosuvastatin (CRESTOR) 40 MG tablet Take 40 mg by mouth daily.    [provider]  sildenafil (REVATIO) 20 MG tablet Take 20-100 mg by mouth as needed (for sex).    [provider]  traZODone (DESYREL) 50 MG tablet Take 50 mg by mouth at bedtime as needed for sleep.    [provider]    Physical Exam   Triage Vital Signs: ED Triage Vitals  Enc Vitals Group     BP 04/21/22 0438 123/76     Pulse Rate 04/21/22 0438 86     Resp 04/21/22 0438 18     Temp 04/21/22 0438 98.2 F (36.8 C)     Temp Source 04/21/22 0438 Oral     SpO2 04/21/22 0438 99 %     Weight 04/21/22 0436 190 lb (86.2 kg)     Height 04/21/22 0436 5\' 11"  (1.803 m)     Head Circumference --      Peak Flow --  Pain Score 04/21/22 0436 8     Pain Loc --      Pain Edu? --      Excl. in GC? --     Most recent vital signs: Vitals:   04/21/22 0438  BP: 123/76  Pulse: 86  Resp: 18  Temp: 98.2 F (36.8 C)  SpO2: 99%    CONSTITUTIONAL: Alert and oriented and responds appropriately to questions. Well-appearing; well-nourished HEAD: Normocephalic, atraumatic EYES: Conjunctivae clear, pupils appear equal, sclera nonicteric ENT: normal nose; moist mucous membranes NECK: Supple, normal ROM CARD: RRR; S1 and S2 appreciated; no murmurs, no clicks, no rubs, no gallops RESP: Normal chest excursion without splinting or tachypnea; breath sounds clear and equal bilaterally; no wheezes, no rhonchi, no rales, no hypoxia or respiratory distress, speaking full sentences ABD/GI: Normal bowel sounds; non-distended; soft, tender to palpation throughout the lower abdomen without guarding  or rebound.  No hernia. BACK: The back appears normal EXT: Normal ROM in all joints; no deformity noted, no edema; no cyanosis SKIN: Normal color for age and race; warm; no rash on exposed skin NEURO: Moves all extremities equally, normal speech PSYCH: The patient's mood and manner are appropriate.   ED Results / Procedures / Treatments   LABS: (all labs ordered are listed, but only abnormal results are displayed) Labs Reviewed  COMPREHENSIVE METABOLIC PANEL - Abnormal; Notable for the following components:      Result Value   Sodium 131 (*)    Chloride 93 (*)    Glucose, Bld 117 (*)    Creatinine, Ser 1.46 (*)    Calcium 8.8 (*)    GFR, Estimated 53 (*)    All other components within normal limits  CBC - Abnormal; Notable for the following components:   WBC 23.3 (*)    All other components within normal limits  URINALYSIS, ROUTINE W REFLEX MICROSCOPIC - Abnormal; Notable for the following components:   Color, Urine YELLOW (*)    APPearance CLEAR (*)    Hgb urine dipstick SMALL (*)    Leukocytes,Ua TRACE (*)    All other components within normal limits  URINE CULTURE  CHLAMYDIA/NGC RT PCR ([facility] ONLY)            LIPASE, BLOOD     EKG:   RADIOLOGY: My personal review and interpretation of imaging: CT of the abdomen pelvis pending.  I have personally reviewed all radiology reports.   No results found.   PROCEDURES:  Critical Care performed: No     Procedures    IMPRESSION / MDM / ASSESSMENT AND PLAN / ED COURSE  I reviewed the triage vital signs and the nursing notes.   Patient here with complaints of 2 weeks of abdominal pain, nausea and vomiting now having no bowel movement for the past 4 to 5 days.     DIFFERENTIAL DIAGNOSIS (includes but not limited to):   Constipation, bowel obstruction, colitis, diverticulitis, UTI, STI, kidney stone, pyelonephritis   Patient's presentation is most consistent with acute presentation with potential threat to  life or bodily function.   PLAN: Obtain CBC, CMP, lipase, urinalysis, urine gonorrhea and chlamydia, CT of the abdomen pelvis.  Will give fluids, pain and nausea medicine.   MEDICATIONS GIVEN IN ED: Medications  sodium chloride 0.9 % bolus 1,000 mL (has no administration in time range)  ondansetron (ZOFRAN) injection 4 mg (has no administration in time range)  dicyclomine (BENTYL) capsule 10 mg (has no administration in time range)  ondansetron (ZOFRAN-ODT) disintegrating  tablet 4 mg (4 mg Oral Given 04/21/22 0440)     ED COURSE: Patient's labs show leukocytosis of 23,000 which is up from 14,000 on July 5.  Also has an AKI today with creatinine of 1.46.  He is getting IV fluids.  Will avoid NSAIDs.  Urine does show trace leukocyte esterase and 11-20 white blood cells but no bacteria.  We will add on urine culture.  LFTs and lipase unremarkable.   CT of the abdomen pelvis pending.  Signed out the oncoming ED physician at 7 AM.  CONSULTS: Dispo pending CT scan.   OUTSIDE RECORDS REVIEWED: Reviewed patient's last gastroenterology note on 03/11/2021.       FINAL CLINICAL IMPRESSION(S) / ED DIAGNOSES   Final diagnoses:  LLQ abdominal pain     Rx / DC Orders   ED Discharge Orders     None        Note:  This document was prepared using Dragon voice recognition software and may include unintentional dictation errors.   Gil Ingwersen, Layla Maw, DO 04/21/22 [phone]

## 2022-04-21 NOTE — ED Provider Notes (Signed)
Patient received in signout from Dr. Elesa Massed pending CT imaging.  Does have leukocytosis.  CT imaging unremarkable.  Repeat exam soft and benign.  Is having occasional cough no signs of pneumonia on chest x-ray no wheezing on exam.  Does endorse some dysuria burning discomfort given his lower abdominal pain given dose of Rocephin due to possible cystitis as he does have many bacteria in urine.  No sign of stone or obstructive uropathy.  Review of CT imaging does have post aorta bifemoral bypass no surrounding inflammation.  Does not have any signs of distal claudication or dissection.  Patient tolerating p.o.  At this point he would be stable and appropriate for outpatient follow-up.  Patient agreeable to plan.   Willy Eddy, MD 04/21/22 1310

## 2022-04-21 NOTE — ED Notes (Signed)
Patient given crackers and water for po challenge.

## 2022-04-21 NOTE — Discharge Instructions (Addendum)

## 2022-04-21 NOTE — ED Notes (Signed)
Patient to xray at this time

## 2022-04-21 NOTE — ED Notes (Signed)
Patient vomiting after PO attempt.

## 2022-04-21 NOTE — ED Triage Notes (Signed)
Pt presents via POV with complaints of N/V with associated lower abdominal pain and weakness for the last 2 weeks. Pt states he was seen 2 weeks ago for the same sx and nothing has improved. Denies urinary sx, CP or SOB.

## 2022-04-22 LAB — URINE CULTURE: Culture: NO GROWTH

## 2022-05-02 DIAGNOSIS — R8281 Pyuria: Secondary | ICD-10-CM | POA: Diagnosis not present

## 2022-05-02 DIAGNOSIS — R109 Unspecified abdominal pain: Secondary | ICD-10-CM | POA: Diagnosis not present

## 2022-05-04 ENCOUNTER — Other Ambulatory Visit: Payer: Self-pay

## 2022-05-04 ENCOUNTER — Emergency Department: Payer: Medicare HMO

## 2022-05-04 ENCOUNTER — Inpatient Hospital Stay
Admission: EM | Admit: 2022-05-04 | Discharge: 2022-05-05 | DRG: 316 | Discharge status: Against Medical Advice | Payer: Medicare HMO | Attending: Internal Medicine | Admitting: Internal Medicine

## 2022-05-04 DIAGNOSIS — F17210 Nicotine dependence, cigarettes, uncomplicated: Secondary | ICD-10-CM | POA: Diagnosis present

## 2022-05-04 DIAGNOSIS — I252 Old myocardial infarction: Secondary | ICD-10-CM | POA: Diagnosis not present

## 2022-05-04 DIAGNOSIS — T827XXA Infection and inflammatory reaction due to other cardiac and vascular devices, implants and grafts, initial encounter: Principal | ICD-10-CM | POA: Diagnosis present

## 2022-05-04 DIAGNOSIS — Y832 Surgical operation with anastomosis, bypass or graft as the cause of abnormal reaction of the patient, or of later complication, without mention of misadventure at the time of the procedure: Secondary | ICD-10-CM | POA: Diagnosis present

## 2022-05-04 DIAGNOSIS — Z951 Presence of aortocoronary bypass graft: Secondary | ICD-10-CM | POA: Diagnosis not present

## 2022-05-04 DIAGNOSIS — I2510 Atherosclerotic heart disease of native coronary artery without angina pectoris: Secondary | ICD-10-CM | POA: Diagnosis not present

## 2022-05-04 DIAGNOSIS — Z791 Long term (current) use of non-steroidal anti-inflammatories (NSAID): Secondary | ICD-10-CM | POA: Diagnosis not present

## 2022-05-04 DIAGNOSIS — I745 Embolism and thrombosis of iliac artery: Secondary | ICD-10-CM | POA: Diagnosis not present

## 2022-05-04 DIAGNOSIS — Z7982 Long term (current) use of aspirin: Secondary | ICD-10-CM | POA: Diagnosis not present

## 2022-05-04 DIAGNOSIS — Z95828 Presence of other vascular implants and grafts: Secondary | ICD-10-CM

## 2022-05-04 DIAGNOSIS — N281 Cyst of kidney, acquired: Secondary | ICD-10-CM | POA: Diagnosis not present

## 2022-05-04 DIAGNOSIS — M5126 Other intervertebral disc displacement, lumbar region: Secondary | ICD-10-CM | POA: Diagnosis not present

## 2022-05-04 DIAGNOSIS — R300 Dysuria: Secondary | ICD-10-CM | POA: Diagnosis not present

## 2022-05-04 DIAGNOSIS — I714 Abdominal aortic aneurysm, without rupture, unspecified: Secondary | ICD-10-CM | POA: Diagnosis not present

## 2022-05-04 DIAGNOSIS — Z79899 Other long term (current) drug therapy: Secondary | ICD-10-CM

## 2022-05-04 DIAGNOSIS — I10 Essential (primary) hypertension: Secondary | ICD-10-CM | POA: Diagnosis present

## 2022-05-04 DIAGNOSIS — Z5329 Procedure and treatment not carried out because of patient's decision for other reasons: Secondary | ICD-10-CM | POA: Diagnosis not present

## 2022-05-04 DIAGNOSIS — M5127 Other intervertebral disc displacement, lumbosacral region: Secondary | ICD-10-CM | POA: Diagnosis not present

## 2022-05-04 DIAGNOSIS — E785 Hyperlipidemia, unspecified: Secondary | ICD-10-CM | POA: Diagnosis present

## 2022-05-04 DIAGNOSIS — Z955 Presence of coronary angioplasty implant and graft: Secondary | ICD-10-CM | POA: Diagnosis not present

## 2022-05-04 DIAGNOSIS — I700 Atherosclerosis of aorta: Secondary | ICD-10-CM | POA: Diagnosis not present

## 2022-05-04 LAB — CBC WITH DIFFERENTIAL/PLATELET
Abs Immature Granulocytes: 0.04 10*3/uL (ref 0.00–0.07)
Basophils Absolute: 0 10*3/uL (ref 0.0–0.1)
Basophils Relative: 0 %
Eosinophils Absolute: 0.1 10*3/uL (ref 0.0–0.5)
Eosinophils Relative: 0 %
HCT: 47.3 % (ref 39.0–52.0)
Hemoglobin: 15.4 g/dL (ref 13.0–17.0)
Immature Granulocytes: 0 %
Lymphocytes Relative: 18 %
Lymphs Abs: 2.1 10*3/uL (ref 0.7–4.0)
MCH: 27.9 pg (ref 26.0–34.0)
MCHC: 32.6 g/dL (ref 30.0–36.0)
MCV: 85.8 fL (ref 80.0–100.0)
Monocytes Absolute: 1.4 10*3/uL — ABNORMAL HIGH (ref 0.1–1.0)
Monocytes Relative: 12 %
Neutro Abs: 8.3 10*3/uL — ABNORMAL HIGH (ref 1.7–7.7)
Neutrophils Relative %: 70 %
Platelets: 329 10*3/uL (ref 150–400)
RBC: 5.51 MIL/uL (ref 4.22–5.81)
RDW: 13.3 % (ref 11.5–15.5)
WBC: 12 10*3/uL — ABNORMAL HIGH (ref 4.0–10.5)
nRBC: 0 % (ref 0.0–0.2)

## 2022-05-04 LAB — CHLAMYDIA/NGC RT PCR (ARMC ONLY)
Chlamydia Tr: NOT DETECTED
N gonorrhoeae: NOT DETECTED

## 2022-05-04 LAB — URINALYSIS, COMPLETE (UACMP) WITH MICROSCOPIC
Bacteria, UA: NONE SEEN
Bilirubin Urine: NEGATIVE
Glucose, UA: NEGATIVE mg/dL
Hgb urine dipstick: NEGATIVE
Ketones, ur: NEGATIVE mg/dL
Leukocytes,Ua: NEGATIVE
Nitrite: NEGATIVE
Protein, ur: NEGATIVE mg/dL
Specific Gravity, Urine: 1.016 (ref 1.005–1.030)
pH: 5 (ref 5.0–8.0)

## 2022-05-04 LAB — BASIC METABOLIC PANEL
Anion gap: 10 (ref 5–15)
BUN: 14 mg/dL (ref 8–23)
CO2: 26 mmol/L (ref 22–32)
Calcium: 8.9 mg/dL (ref 8.9–10.3)
Chloride: 98 mmol/L (ref 98–111)
Creatinine, Ser: 1.09 mg/dL (ref 0.61–1.24)
GFR, Estimated: >60 mL/min (ref >60)
Glucose, Bld: 84 mg/dL (ref 70–99)
Potassium: 3.9 mmol/L (ref 3.5–5.1)
Sodium: 134 mmol/L — ABNORMAL LOW (ref 135–145)

## 2022-05-04 LAB — LACTIC ACID, PLASMA: Lactic Acid, Venous: 1.5 mmol/L (ref 0.5–1.9)

## 2022-05-04 LAB — LIPASE, BLOOD: Lipase: 23 U/L (ref 11–51)

## 2022-05-04 MED ORDER — LACTATED RINGERS IV BOLUS
500.0000 mL | Freq: Once | INTRAVENOUS | Status: AC
  Administered 2022-05-04: 500 mL via INTRAVENOUS

## 2022-05-04 MED ORDER — IOHEXOL 350 MG/ML SOLN
100.0000 mL | Freq: Once | INTRAVENOUS | Status: AC | PRN
  Administered 2022-05-04: 100 mL via INTRAVENOUS

## 2022-05-04 MED ORDER — ACETAMINOPHEN 325 MG PO TABS: 650.0000 mg | ORAL_TABLET | Freq: Four times a day (QID) | ORAL | Status: DC | PRN

## 2022-05-04 MED ORDER — MORPHINE SULFATE (PF) 4 MG/ML IV SOLN
4.0000 mg | Freq: Once | INTRAVENOUS | Status: AC
  Administered 2022-05-04: 4 mg via INTRAVENOUS
  Filled 2022-05-04: qty 1

## 2022-05-04 MED ORDER — HEPARIN SODIUM (PORCINE) 5000 UNIT/ML IJ SOLN
5000.0000 [IU] | Freq: Three times a day (TID) | INTRAMUSCULAR | Status: DC
Start: 2022-05-04 — End: 2022-05-05
  Administered 2022-05-04 – 2022-05-05 (×2): 5000 [IU] via SUBCUTANEOUS
  Filled 2022-05-04 (×2): qty 1

## 2022-05-04 MED ORDER — VANCOMYCIN HCL 1750 MG/350ML IV SOLN
1750.0000 mg | Freq: Once | INTRAVENOUS | Status: AC
  Administered 2022-05-04: 1750 mg via INTRAVENOUS
  Filled 2022-05-04: qty 350

## 2022-05-04 MED ORDER — SODIUM CHLORIDE 0.9 % IV SOLN
2.0000 g | Freq: Three times a day (TID) | INTRAVENOUS | Status: DC
  Administered 2022-05-04 – 2022-05-05 (×2): 2 g via INTRAVENOUS
  Filled 2022-05-04 (×5): qty 2

## 2022-05-04 MED ORDER — KETOROLAC TROMETHAMINE 15 MG/ML IJ SOLN
15.0000 mg | Freq: Once | INTRAMUSCULAR | Status: AC
  Administered 2022-05-04: 15 mg via INTRAMUSCULAR
  Filled 2022-05-04: qty 1

## 2022-05-04 MED ORDER — MORPHINE SULFATE (PF) 2 MG/ML IV SOLN
1.0000 mg | INTRAVENOUS | Status: DC | PRN
  Administered 2022-05-05: 1 mg via INTRAVENOUS
  Filled 2022-05-04 (×2): qty 1

## 2022-05-04 MED ORDER — VANCOMYCIN HCL IN DEXTROSE 1-5 GM/200ML-% IV SOLN: 1000.0000 mg | Freq: Once | INTRAVENOUS | Status: DC

## 2022-05-04 MED ORDER — ESCITALOPRAM OXALATE 10 MG PO TABS
10.0000 mg | ORAL_TABLET | Freq: Every day | ORAL | Status: DC
  Administered 2022-05-04 – 2022-05-05 (×2): 10 mg via ORAL
  Filled 2022-05-04 (×2): qty 1

## 2022-05-04 MED ORDER — ROSUVASTATIN CALCIUM 20 MG PO TABS
40.0000 mg | ORAL_TABLET | Freq: Every day | ORAL | Status: DC
  Administered 2022-05-05: 40 mg via ORAL
  Filled 2022-05-04: qty 2

## 2022-05-04 MED ORDER — ACETAMINOPHEN 650 MG RE SUPP: 650.0000 mg | Freq: Four times a day (QID) | RECTAL | Status: DC | PRN

## 2022-05-04 MED ORDER — METRONIDAZOLE 500 MG/100ML IV SOLN
500.0000 mg | Freq: Three times a day (TID) | INTRAVENOUS | Status: DC
  Administered 2022-05-04 – 2022-05-05 (×2): 500 mg via INTRAVENOUS
  Filled 2022-05-04 (×2): qty 100

## 2022-05-04 MED ORDER — AMLODIPINE BESYLATE 5 MG PO TABS
10.0000 mg | ORAL_TABLET | Freq: Every day | ORAL | Status: DC
  Administered 2022-05-05: 10 mg via ORAL
  Filled 2022-05-04: qty 2

## 2022-05-04 MED ORDER — PIPERACILLIN-TAZOBACTAM 3.375 G IVPB 30 MIN: 3.3750 g | Freq: Once | INTRAVENOUS | Status: DC

## 2022-05-04 MED ORDER — ASPIRIN 81 MG PO TBEC
81.0000 mg | DELAYED_RELEASE_TABLET | Freq: Every day | ORAL | Status: DC
  Administered 2022-05-04 – 2022-05-05 (×2): 81 mg via ORAL
  Filled 2022-05-04 (×2): qty 1

## 2022-05-04 MED ORDER — TRAZODONE HCL 50 MG PO TABS
50.0000 mg | ORAL_TABLET | Freq: Every evening | ORAL | Status: DC | PRN
  Administered 2022-05-05: 50 mg via ORAL
  Filled 2022-05-04: qty 1

## 2022-05-04 MED ORDER — HYDROCHLOROTHIAZIDE 25 MG PO TABS
25.0000 mg | ORAL_TABLET | Freq: Every day | ORAL | Status: DC
  Administered 2022-05-04 – 2022-05-05 (×2): 25 mg via ORAL
  Filled 2022-05-04 (×2): qty 1

## 2022-05-04 MED ORDER — CARVEDILOL 6.25 MG PO TABS
12.5000 mg | ORAL_TABLET | Freq: Two times a day (BID) | ORAL | Status: DC
  Administered 2022-05-05: 12.5 mg via ORAL
  Filled 2022-05-04: qty 2

## 2022-05-04 NOTE — ED Provider Notes (Signed)
-----------------------------------------   5:38 PM on 05/04/2022 ----------------------------------------- Patient care assumed from Dr. Scotty Court.  Patient CT scan is concerning for an abscess abutting the aortobifem graft.  Patient follows up at [facility] for this.  I spoke to Dr. Gilda Crease of vascular surgery who has reviewed the CT images and is also concern for possible abscess/infection of the graft and recommend to the patient be transferred back to [facility] for evaluation.  Here the patient appears very well, reassuring vitals, afebrile very slight leukocytosis 12,000, reassuring lactate of 1.5.  We will cover broad-spectrum antibiotics.  I spoke to [facility] Dr. Fenton Malling of vascular surgery who recommends covering with vancomycin ceftazidime and Flagyl.  He also asked if vascular surgery can evaluate the patient here.  We will discuss with vascular surgery for a consultation.  Patient is currently on a wait list at [facility] but the vascular surgeon at [facility] states he will be the first person on that wait list.  Patient agreeable to plan of care.   Minna Antis, MD 05/04/22 1739

## 2022-05-04 NOTE — ED Notes (Signed)
Pt given specimen cup for UA.

## 2022-05-04 NOTE — ED Notes (Signed)
Now pt reports "it hurts when I pee".

## 2022-05-04 NOTE — ED Notes (Signed)
Pt given meal tray.

## 2022-05-04 NOTE — ED Notes (Signed)
ctap

## 2022-05-04 NOTE — ED Triage Notes (Signed)
Pt in from home due to bladder infection symptoms x1 month per pt; states "I don't wanna eat nothin'". Pt reports sharp lower abdominal pain x1 month. States "I want to get checked for an STD". Denies fever. States sometimes gets nauseous and so hasn't been eating as much. Reports zofran has helped a little. Denies changes to urination, BM's, drainage, itchiness, rash. Pt's primary doc recently prescribed levofloxacin which he has been taking for 3 days but states "I don't wanna keep taking that because it hasn't helped yet".

## 2022-05-04 NOTE — H&P (Signed)
History and Physical    Craig ROMER Sr. IRW:[id number] DOB: Jan 22, 1956 DOA: 05/04/2022  PCP: [name], MD  Patient coming from: Home.  Chief Complaint: Abdominal pain.  HPI: Craig ALBOR Sr. is a 66 y.o. male with history of CAD s/p stenting, hypertension, peripheral vascular disease presents to the ER for the third time with the significant abdominal pain.  Patient states his pain has been mostly in the left periumbilical area radiating to the back.  Has been having some subjective fever and chills nausea poor appetite no diarrhea.  ED Course: In the ER patient was afebrile.  CT angiogram of the abdomen pelvis shows features concerning for fluid collection around the aortofemoral bypass graft concerning for infection.  ER physician discussed with on-call vascular surgeon Dr. Gilda Crease who advised patient to be transferred to [facility] where patient usually follows.  ER physician discussed with Dr. Fenton Malling of vascular surgery at [facility] who advised patient being started on antibiotics and has accepted the patient at [facility] but there was no rooms available and is first in the waiting list.  And requested also to see with the vascular surgeon at [facility] could also see as a consult.  Review of Systems: As per HPI, rest all negative.   Past Medical History:  Diagnosis Date   HLD (hyperlipidemia)    Hypertension    Myocardial infarction [facility])     Past Surgical History:  Procedure Laterality Date   CORONARY ANGIOPLASTY WITH STENT PLACEMENT     FEMORAL ARTERY STENT       reports that he has been smoking cigarettes. He has never used smokeless tobacco. He reports that he does not currently use alcohol. He reports that he does not currently use drugs.  No Known Allergies  History reviewed. No pertinent family history.  Prior to Admission medications   Medication Sig Start Date End Date Taking? Authorizing Provider  amLODipine (NORVASC) 10 MG tablet Take  10 mg by mouth daily.    [provider]  aspirin EC 81 MG tablet Take 81 mg by mouth daily. Swallow whole.    [provider]  carvedilol (COREG) 12.5 MG tablet Take 12.5 mg by mouth 2 (two) times daily with a meal.    [provider]  escitalopram (LEXAPRO) 10 MG tablet Take 10 mg by mouth daily.    [provider]  hydrochlorothiazide (HYDRODIURIL) 25 MG tablet Take 25 mg by mouth daily.    [provider]  meloxicam (MOBIC) 15 MG tablet Take 15 mg by mouth daily.    [provider]  Multiple Vitamin (MULTIVITAMIN) tablet Take 1 tablet by mouth daily.    [provider]  nitroGLYCERIN (NITROSTAT) 0.4 MG SL tablet Place 0.4 mg under the tongue every 5 (five) minutes as needed for chest pain.    [provider]  ondansetron (ZOFRAN-ODT) 4 MG disintegrating tablet Take 1 tablet (4 mg total) by mouth every 8 (eight) hours as needed for nausea or vomiting. 04/07/22   Chesley Noon, MD  ondansetron (ZOFRAN-ODT) 4 MG disintegrating tablet Take 1 tablet (4 mg total) by mouth every 8 (eight) hours as needed for nausea or vomiting. 04/21/22   Willy Eddy, MD  rosuvastatin (CRESTOR) 40 MG tablet Take 40 mg by mouth daily.    [provider]  sildenafil (REVATIO) 20 MG tablet Take 20-100 mg by mouth as needed (for sex).    [provider]  traZODone (DESYREL) 50 MG  tablet Take 50 mg by mouth at bedtime as needed for sleep.    [provider]    Physical Exam: Constitutional: Moderately built and nourished. Vitals:   05/04/22 1218 05/04/22 1221 05/04/22 1712 05/04/22 2003  BP: 110/75  135/71   Pulse: 75  65   Resp:   16   Temp: 97.8 F (36.6 C)  98.9 F (37.2 C) 98 F (36.7 C)  TempSrc: Oral  Oral Oral  SpO2: 100%  97%   Weight:  82.6 kg    Height:  5\' 11"  (1.803 m)     Eyes: Anicteric no pallor. ENMT: No discharge from the ears eyes nose and mouth. Neck: No mass felt.  No neck  rigidity. Respiratory: No rhonchi or crepitations. Cardiovascular: S1-S2 heard. Abdomen: Soft nontender bowel sound present. Musculoskeletal: No edema. Skin: No rash. Neurologic: Alert awake oriented time place and person.  Moves all extremities. Psychiatric: Appears normal.  Normal affect.   Labs on Admission: I have personally reviewed following labs and imaging studies  CBC: Recent Labs  Lab 05/04/22 1233  WBC 12.0*  NEUTROABS 8.3*  HGB 15.4  HCT 47.3  MCV 85.8  PLT 329   Basic Metabolic Panel: Recent Labs  Lab 05/04/22 1233  NA 134*  K 3.9  CL 98  CO2 26  GLUCOSE 84  BUN 14  CREATININE 1.09  CALCIUM 8.9   GFR: Estimated Creatinine Clearance: 72 mL/min (by C-G formula based on SCr of 1.09 mg/dL). Liver Function Tests: No results for input(s): "AST", "ALT", "ALKPHOS", "BILITOT", "PROT", "ALBUMIN" in the last 168 hours. Recent Labs  Lab 05/04/22 1233  LIPASE 23   No results for input(s): "AMMONIA" in the last 168 hours. Coagulation Profile: No results for input(s): "INR", "PROTIME" in the last 168 hours. Cardiac Enzymes: No results for input(s): "CKTOTAL", "CKMB", "CKMBINDEX", "TROPONINI" in the last 168 hours. BNP (last 3 results) No results for input(s): "PROBNP" in the last 8760 hours. HbA1C: No results for input(s): "HGBA1C" in the last 72 hours. CBG: No results for input(s): "GLUCAP" in the last 168 hours. Lipid Profile: No results for input(s): "CHOL", "HDL", "LDLCALC", "TRIG", "CHOLHDL", "LDLDIRECT" in the last 72 hours. Thyroid Function Tests: No results for input(s): "TSH", "T4TOTAL", "FREET4", "T3FREE", "THYROIDAB" in the last 72 hours. Anemia Panel: No results for input(s): "VITAMINB12", "FOLATE", "FERRITIN", "TIBC", "IRON", "RETICCTPCT" in the last 72 hours. Urine analysis:    Component Value Date/Time   COLORURINE YELLOW (A) 05/04/2022 1230   APPEARANCEUR CLEAR (A) 05/04/2022 1230   LABSPEC 1.016 05/04/2022 1230   PHURINE 5.0  05/04/2022 1230   GLUCOSEU NEGATIVE 05/04/2022 1230   HGBUR NEGATIVE 05/04/2022 1230   BILIRUBINUR NEGATIVE 05/04/2022 1230   KETONESUR NEGATIVE 05/04/2022 1230   PROTEINUR NEGATIVE 05/04/2022 1230   NITRITE NEGATIVE 05/04/2022 1230   LEUKOCYTESUR NEGATIVE 05/04/2022 1230   Sepsis Labs: @LABRCNTIP (procalcitonin:4,lacticidven:4) ) Recent Results (from the past 240 hour(s))  Chlamydia/NGC rt PCR ([facility] only)     Status: None   Collection Time: 05/04/22 12:30 PM   Specimen: Urine, Random; GU  Result Value Ref Range Status   Specimen source GC/Chlam URINE, RANDOM  Final   Chlamydia Tr NOT DETECTED NOT DETECTED Final   N gonorrhoeae NOT DETECTED NOT DETECTED Final    Comment: (NOTE) This CT/NG assay has not been evaluated in patients with a history of  hysterectomy. Performed at [facility], [address], [place], [address] [place]      Radiological Exams on Admission: CT Angio  Abd/Pel W and/or Wo Contrast  Result Date: 05/04/2022 CLINICAL DATA:  Aortic aneurysm (AAA), surveillance eval fluid at distal aorta bypass graft, post aortobifem 11/07/2003 EXAM: CTA ABDOMEN AND PELVIS WITHOUT AND WITH CONTRAST TECHNIQUE: Multidetector CT imaging of the abdomen and pelvis was performed using the standard protocol during bolus administration of intravenous contrast. Multiplanar reconstructed images and MIPs were obtained and reviewed to evaluate the vascular anatomy. RADIATION DOSE REDUCTION: This exam was performed according to the departmental dose-optimization program which includes automated exposure control, adjustment of the mA and/or kV according to patient size and/or use of iterative reconstruction technique. CONTRAST:  OMNIPAQUE IOHEXOL 350 MG/ML SOLN COMPARISON:  Noncontrast study from earlier the same day, CT 04/21/2022 FINDINGS: VASCULAR Aorta: Mild calcified atheromatous plaque in the suprarenal and juxtarenal segments. Post infrarenal aortobifem graft. No evidence of  pseudoaneurysm or extravasation. Persistent perigraft fluid since 04/21/2022. Some increase in a loculated fluid attenuation at the anterior margin of the graft at the level of the native aortic bifurcation, approximately 3 x 2.6 cm maximum transverse dimensions (previously 2.2 x 1.3). Celiac: Patent without evidence of aneurysm, dissection, vasculitis or significant stenosis. SMA: Minimal nonocclusive plaque. No stenosis. Classic distal branch anatomy. Renals: Single bilaterally. Scattered calcified plaque without high-grade stenosis. IMA: Origin occlusion/ligation, reconstituted distally by visceral collaterals. Inflow: Native iliac arterial systems are occluded. Patent distal graft anastomoses to the common femoral arteries. Proximal Outflow: Proximal occlusion of the left SFA. Proximal occlusion of the right SFA. Bilateral deep femoral branches patent. Veins: No obvious venous abnormality within the limitations of this arterial phase study. Review of the MIP images confirms the above findings. NON-VASCULAR Lower chest: No pleural or pericardial effusion. Pulmonary emphysema. Hepatobiliary: No focal liver abnormality is seen. No gallstones, gallbladder wall thickening, or biliary dilatation. Pancreas: Unremarkable. No pancreatic ductal dilatation or surrounding inflammatory changes. Spleen: Normal in size without focal abnormality. Adrenals/Urinary Tract: No adrenal mass. Left kidney unremarkable. Stable large right parapelvic renal cyst; no follow-up recommended. Urinary bladder incompletely distended. Stomach/Bowel: Stomach and small bowel are decompressed. Normal appendix. The colon is incompletely distended. Multiple distal descending and sigmoid diverticula; no significant adjacent inflammatory change. Lymphatic: No abdominal or pelvic adenopathy. Reproductive: Prostate is unremarkable. Other: No ascites.  No free air. Musculoskeletal: Bilateral hip DJD. Multilevel spondylitic change in the lower thoracic  and lumbar spine. No acute findings. IMPRESSION: VASCULAR 1. Enlarging fluid collection around the aortobifem graft. Correlate with any clinical/laboratory evidence of graft infection. 2. Patent aortobifem graft.  Bilateral proximal SFA occlusion. NON-VASCULAR 1. Descending and sigmoid diverticulosis. Electronically Signed   By: Corlis Leak M.D.   On: 05/04/2022 16:07   CT Renal Stone Study  Result Date: 05/04/2022 CLINICAL DATA:  Bladder infection symptoms for 1 month. Lower abdominal pain. EXAM: CT ABDOMEN AND PELVIS WITHOUT CONTRAST TECHNIQUE: Multidetector CT imaging of the abdomen and pelvis was performed following the standard protocol without IV contrast. RADIATION DOSE REDUCTION: This exam was performed according to the departmental dose-optimization program which includes automated exposure control, adjustment of the mA and/or kV according to patient size and/or use of iterative reconstruction technique. COMPARISON:  04/21/2022 FINDINGS: Lower chest: Motion degradation. Grossly clear lung bases. Normal heart size without pericardial or pleural effusion. Hepatobiliary: Motion degradation continuing into the upper abdomen. Grossly normal noncontrast appearance of the liver, gallbladder, biliary tract. Pancreas: Normal, without mass or ductal dilatation. Spleen: Normal in size, without focal abnormality. Adrenals/Urinary Tract: Normal adrenal glands. Right renal sinus cyst measures 4.0 x 4.3 cm on 33/2 .  In the absence of clinically indicated signs/symptoms require(s) no independent follow-up. No urinary tract calculi or hydronephrosis. No hydroureter or bladder calculi. Stomach/Bowel: Normal stomach, without wall thickening. Scattered colonic diverticula. Normal terminal ileum and appendix. Normal small bowel. Vascular/Lymphatic: Aortic atherosclerosis. Suboptimal evaluation of aortic/bifem bypass. Fluid about the anterior aspect of the graft at the level of the aortic bifurcation measures 3.2 x 2.8 cm on  47/2 versus 2.4 x 1.7 cm on the prior exam (when remeasured). No abdominopelvic adenopathy. Reproductive: Normal prostate. Other: No significant free fluid.  No free intraperitoneal air. Musculoskeletal: Degenerative partial fusion of the bilateral sacroiliac joints. Disc bulges at L4-5 and less so L5-S1. IMPRESSION: 1. No urinary tract calculi or hydronephrosis. Motion degraded exam which is otherwise limited by stone study technique. 2. Status post aortic/bifem bypass. Increased fluid density about the anterior aspect of the distal aortic portion of the bypass graft. Consider dedicated CTA to evaluate for infection or other complication. 3.  Aortic Atherosclerosis (ICD10-I70.0). Electronically Signed   By: Jeronimo Greaves M.D.   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 Lorretta Harp vascular surgeon who advised transfer to [facility].  ER physician did discuss with Dr. Fenton Malling vascular surgeon at [facility] who has accepted the patient but no rooms were available.  Patient is waitlisted.  Until then patient has been started on antibiotics admitted for further observation at [facility].  ER physician has further discussed with Dr. Gilda Crease both admission aware.  Follow cultures. Hypertension we will continue home dose of amlodipine, hydrochlorothiazide Coreg. History of CAD on Coreg statins aspirin.   DVT prophylaxis: Heparin. Code Status: Full code. Family Communication: Discussed with patient. Disposition Plan: Plan to transfer to [facility] once room is available. Consults called: ER physician discussed with vascular surgeon at [facility] Dr. Gilda Crease and Dr. Fenton Malling at [facility] who has accepted the patient.  Awaiting room. Admission status: Observation.   Eduard Clos MD [facility] Pager [phone].  If 7PM-7AM, please contact night-coverage [email or web address] Password [email or web address]  05/04/2022, 8:25 PM

## 2022-05-04 NOTE — Consult Note (Signed)
PHARMACY -  BRIEF ANTIBIOTIC NOTE   Pharmacy has received consult(s) for ceftazidime, vancomycin, and flagyl from an ED provider.  The patient's profile has been reviewed for ht/wt/allergies/indication/available labs.    Order(s) placed for Ceftazidime 2gm IV q8h + Vancomycin 1g IV once + Flagyl 500mg  IV every 8 hours                  Thank you, 05/04/2022  5:51 PM

## 2022-05-04 NOTE — ED Provider Notes (Signed)
[facility] Provider Note    Event Date/Time   First MD Initiated Contact with Patient 05/04/22 1246     (approximate)   History   Chief Complaint: Dysuria  HPI  Craig RUTLEDGE Sr. is a 66 y.o. male with a past history of CAD, hypertension, aortofemoral bypass graft who comes ED complaining of dysuria for the past month.  He has been seen multiple times, completed 2 courses of Keflex, and a few days ago saw his primary care doctor who started him on a 10-day course of Levaquin.  Denies any chest pain or shortness of breath, no lower extremity weakness or paresthesia.  No vomiting.  Eating normally.     Physical Exam   Triage Vital Signs: ED Triage Vitals  Enc Vitals Group     BP 05/04/22 1218 110/75     Pulse Rate 05/04/22 1218 75     Resp --      Temp 05/04/22 1218 97.8 F (36.6 C)     Temp Source 05/04/22 1218 Oral     SpO2 05/04/22 1218 100 %     Weight 05/04/22 1221 182 lb (82.6 kg)     Height 05/04/22 1221 5\' 11"  (1.803 m)     Head Circumference --      Peak Flow --      Pain Score 05/04/22 1220 8     Pain Loc --      Pain Edu? --      Excl. in GC? --     Most recent vital signs: Vitals:   05/04/22 1218  BP: 110/75  Pulse: 75  Temp: 97.8 F (36.6 C)  SpO2: 100%    General: Awake, no distress.  CV:  Good peripheral perfusion.  Regular rate and rhythm Resp:  Normal effort.  Abd:  No distention.  Soft without focal tenderness.  No hernia.  No inguinal lymphadenopathy.    ED Results / Procedures / Treatments   Labs (all labs ordered are listed, but only abnormal results are displayed) Labs Reviewed  URINALYSIS, COMPLETE (UACMP) WITH MICROSCOPIC - Abnormal; Notable for the following components:      Result Value   Color, Urine YELLOW (*)    APPearance CLEAR (*)    All other components within normal limits  CBC WITH DIFFERENTIAL/PLATELET - Abnormal; Notable for the following components:   WBC 12.0 (*)    Neutro Abs 8.3  (*)    Monocytes Absolute 1.4 (*)    All other components within normal limits  BASIC METABOLIC PANEL - Abnormal; Notable for the following components:   Sodium 134 (*)    All other components within normal limits  CHLAMYDIA/NGC RT PCR ([facility] ONLY)            LIPASE, BLOOD     EKG    RADIOLOGY CT renal stone study interpreted by me, negative for bowel obstruction or ureterolithiasis.  Bladder is decompressed.  Radiology report reviewed which notes slightly increased fluid collection at the location of the distal aorta compared to previous CT scan 2 weeks ago.   PROCEDURES:  Procedures   MEDICATIONS ORDERED IN ED: Medications  ketorolac (TORADOL) 15 MG/ML injection 15 mg (15 mg Intramuscular Given 05/04/22 1423)     IMPRESSION / MDM / ASSESSMENT AND PLAN / ED COURSE  I reviewed the triage vital signs and the nursing notes.  Differential diagnosis includes, but is not limited to, cystitis, STI, prostatitis, ureterolithiasis, diverticulitis  Patient's presentation is most consistent with acute presentation with potential threat to life or bodily function.  Patient presents with persistent dysuria for the past month despite multiple courses of antibiotics.  Currently on Levaquin but stopped taking it after 3 days due to feeling like she was not getting any better.  Abdomen is nonsurgical.  Presentation is not consistent with mesenteric ischemia, aortic dissection, or ruptured aneurysm.    ----------------------------------------- 2:51 PM on 05/04/2022 ----------------------------------------- Lab panel is entirely normal.  Noncontrast CT abdomen concerning for enlarging fluid collection adjacent to the aorta.  Will obtain a CT angiogram to further evaluate.       FINAL CLINICAL IMPRESSION(S) / ED DIAGNOSES   Final diagnoses:  Dysuria  Status post aortobifemoral bypass surgery     Rx / DC Orders   ED Discharge Orders     None         Note:  This document was prepared using Dragon voice recognition software and may include unintentional dictation errors.   Sharman Cheek, MD 05/04/22 1452

## 2022-05-04 NOTE — ED Provider Triage Note (Signed)
Emergency Medicine Provider Triage Evaluation Note  ARRION BROADDUS Sr. , a 66 y.o. male  was evaluated in triage.  Pt complains of dysuria. Denies any drainage but want STD test.  Has been on 2 antibiotics with last on Levaquin.  No improvement  Review of Systems  Positive: Nausea, decreased appetitie Negative: No fever, chills  Physical Exam  BP 110/75 (BP Location: Left Arm)   Pulse 75   Temp 97.8 F (36.6 C) (Oral)   Ht 5\' 11"  (1.803 m)   Wt 82.6 kg   SpO2 100%   BMI 25.38 kg/m  Gen:   Awake, no distress   Resp:  Normal effort  MSK:   Moves extremities without difficulty  Other:    Medical Decision Making  Medically screening exam initiated at 12:27 PM.  Appropriate orders placed.  Jerret Mcbane Marland Sr. was informed that the remainder of the evaluation will be completed by another provider, this initial triage assessment does not replace that evaluation, and the importance of remaining in the ED until their evaluation is complete.     Malissa Hippo, PA-C 05/04/22 1234

## 2022-05-04 NOTE — ED Notes (Signed)
[facility]  TRANSF[facility]  CALLED  PER  DR  PADUCHOWSKI  MD  CT  [facility]  WITH  [facility]

## 2022-05-05 ENCOUNTER — Inpatient Hospital Stay: Payer: Medicare HMO

## 2022-05-05 DIAGNOSIS — E1151 Type 2 diabetes mellitus with diabetic peripheral angiopathy without gangrene: Secondary | ICD-10-CM | POA: Diagnosis not present

## 2022-05-05 DIAGNOSIS — R109 Unspecified abdominal pain: Secondary | ICD-10-CM | POA: Diagnosis not present

## 2022-05-05 DIAGNOSIS — I10 Essential (primary) hypertension: Secondary | ICD-10-CM | POA: Diagnosis not present

## 2022-05-05 DIAGNOSIS — I70203 Unspecified atherosclerosis of native arteries of extremities, bilateral legs: Secondary | ICD-10-CM | POA: Diagnosis not present

## 2022-05-05 DIAGNOSIS — J811 Chronic pulmonary edema: Secondary | ICD-10-CM | POA: Diagnosis not present

## 2022-05-05 DIAGNOSIS — G8918 Other acute postprocedural pain: Secondary | ICD-10-CM | POA: Diagnosis not present

## 2022-05-05 DIAGNOSIS — I739 Peripheral vascular disease, unspecified: Secondary | ICD-10-CM | POA: Diagnosis not present

## 2022-05-05 DIAGNOSIS — R140 Abdominal distension (gaseous): Secondary | ICD-10-CM | POA: Diagnosis not present

## 2022-05-05 DIAGNOSIS — T8249XA Other complication of vascular dialysis catheter, initial encounter: Secondary | ICD-10-CM | POA: Diagnosis not present

## 2022-05-05 DIAGNOSIS — R918 Other nonspecific abnormal finding of lung field: Secondary | ICD-10-CM | POA: Diagnosis not present

## 2022-05-05 DIAGNOSIS — Y832 Surgical operation with anastomosis, bypass or graft as the cause of abnormal reaction of the patient, or of later complication, without mention of misadventure at the time of the procedure: Secondary | ICD-10-CM | POA: Diagnosis present

## 2022-05-05 DIAGNOSIS — I471 Supraventricular tachycardia: Secondary | ICD-10-CM | POA: Diagnosis not present

## 2022-05-05 DIAGNOSIS — J90 Pleural effusion, not elsewhere classified: Secondary | ICD-10-CM | POA: Diagnosis not present

## 2022-05-05 DIAGNOSIS — Z01810 Encounter for preprocedural cardiovascular examination: Secondary | ICD-10-CM | POA: Diagnosis not present

## 2022-05-05 DIAGNOSIS — F32A Depression, unspecified: Secondary | ICD-10-CM | POA: Diagnosis not present

## 2022-05-05 DIAGNOSIS — Z955 Presence of coronary angioplasty implant and graft: Secondary | ICD-10-CM | POA: Diagnosis not present

## 2022-05-05 DIAGNOSIS — R059 Cough, unspecified: Secondary | ICD-10-CM | POA: Diagnosis not present

## 2022-05-05 DIAGNOSIS — J9811 Atelectasis: Secondary | ICD-10-CM | POA: Diagnosis not present

## 2022-05-05 DIAGNOSIS — I743 Embolism and thrombosis of arteries of the lower extremities: Secondary | ICD-10-CM | POA: Diagnosis not present

## 2022-05-05 DIAGNOSIS — Z9889 Other specified postprocedural states: Secondary | ICD-10-CM | POA: Diagnosis not present

## 2022-05-05 DIAGNOSIS — R0602 Shortness of breath: Secondary | ICD-10-CM | POA: Diagnosis not present

## 2022-05-05 DIAGNOSIS — K828 Other specified diseases of gallbladder: Secondary | ICD-10-CM | POA: Diagnosis not present

## 2022-05-05 DIAGNOSIS — Z7982 Long term (current) use of aspirin: Secondary | ICD-10-CM | POA: Diagnosis not present

## 2022-05-05 DIAGNOSIS — Z79899 Other long term (current) drug therapy: Secondary | ICD-10-CM | POA: Diagnosis not present

## 2022-05-05 DIAGNOSIS — T827XXD Infection and inflammatory reaction due to other cardiac and vascular devices, implants and grafts, subsequent encounter: Secondary | ICD-10-CM | POA: Diagnosis not present

## 2022-05-05 DIAGNOSIS — T82898A Other specified complication of vascular prosthetic devices, implants and grafts, initial encounter: Secondary | ICD-10-CM | POA: Diagnosis not present

## 2022-05-05 DIAGNOSIS — I771 Stricture of artery: Secondary | ICD-10-CM | POA: Diagnosis not present

## 2022-05-05 DIAGNOSIS — T827XXA Infection and inflammatory reaction due to other cardiac and vascular devices, implants and grafts, initial encounter: Secondary | ICD-10-CM | POA: Diagnosis not present

## 2022-05-05 DIAGNOSIS — J951 Acute pulmonary insufficiency following thoracic surgery: Secondary | ICD-10-CM | POA: Diagnosis not present

## 2022-05-05 DIAGNOSIS — D62 Acute posthemorrhagic anemia: Secondary | ICD-10-CM | POA: Diagnosis not present

## 2022-05-05 DIAGNOSIS — Z791 Long term (current) use of non-steroidal anti-inflammatories (NSAID): Secondary | ICD-10-CM | POA: Diagnosis not present

## 2022-05-05 DIAGNOSIS — F17210 Nicotine dependence, cigarettes, uncomplicated: Secondary | ICD-10-CM | POA: Diagnosis present

## 2022-05-05 DIAGNOSIS — I2510 Atherosclerotic heart disease of native coronary artery without angina pectoris: Secondary | ICD-10-CM | POA: Diagnosis present

## 2022-05-05 DIAGNOSIS — R300 Dysuria: Secondary | ICD-10-CM | POA: Diagnosis present

## 2022-05-05 DIAGNOSIS — E785 Hyperlipidemia, unspecified: Secondary | ICD-10-CM | POA: Diagnosis not present

## 2022-05-05 DIAGNOSIS — R579 Shock, unspecified: Secondary | ICD-10-CM | POA: Diagnosis not present

## 2022-05-05 DIAGNOSIS — I7092 Chronic total occlusion of artery of the extremities: Secondary | ICD-10-CM | POA: Diagnosis not present

## 2022-05-05 DIAGNOSIS — K429 Umbilical hernia without obstruction or gangrene: Secondary | ICD-10-CM | POA: Diagnosis not present

## 2022-05-05 DIAGNOSIS — I252 Old myocardial infarction: Secondary | ICD-10-CM | POA: Diagnosis not present

## 2022-05-05 DIAGNOSIS — Z5329 Procedure and treatment not carried out because of patient's decision for other reasons: Secondary | ICD-10-CM | POA: Diagnosis present

## 2022-05-05 LAB — CBC
HCT: 44.6 % (ref 39.0–52.0)
Hemoglobin: 14.3 g/dL (ref 13.0–17.0)
MCH: 27.8 pg (ref 26.0–34.0)
MCHC: 32.1 g/dL (ref 30.0–36.0)
MCV: 86.6 fL (ref 80.0–100.0)
Platelets: 318 10*3/uL (ref 150–400)
RBC: 5.15 MIL/uL (ref 4.22–5.81)
RDW: 13.4 % (ref 11.5–15.5)
WBC: 12 10*3/uL — ABNORMAL HIGH (ref 4.0–10.5)
nRBC: 0 % (ref 0.0–0.2)

## 2022-05-05 LAB — BASIC METABOLIC PANEL
Anion gap: 10 (ref 5–15)
BUN: 12 mg/dL (ref 8–23)
CO2: 26 mmol/L (ref 22–32)
Calcium: 8.7 mg/dL — ABNORMAL LOW (ref 8.9–10.3)
Chloride: 99 mmol/L (ref 98–111)
Creatinine, Ser: 1.07 mg/dL (ref 0.61–1.24)
GFR, Estimated: >60 mL/min (ref >60)
Glucose, Bld: 105 mg/dL — ABNORMAL HIGH (ref 70–99)
Potassium: 3.9 mmol/L (ref 3.5–5.1)
Sodium: 135 mmol/L (ref 135–145)

## 2022-05-05 LAB — LACTIC ACID, PLASMA: Lactic Acid, Venous: 0.9 mmol/L (ref 0.5–1.9)

## 2022-05-05 LAB — HIV ANTIBODY (ROUTINE TESTING W REFLEX): HIV Screen 4th Generation wRfx: NONREACTIVE

## 2022-05-05 MED ORDER — VANCOMYCIN HCL IN DEXTROSE 1-5 GM/200ML-% IV SOLN: 1000.0000 mg | Freq: Two times a day (BID) | INTRAVENOUS | Status: DC

## 2022-05-05 MED ORDER — IOHEXOL 350 MG/ML SOLN
100.0000 mL | Freq: Once | INTRAVENOUS | Status: AC | PRN
  Administered 2022-05-05: 100 mL via INTRAVENOUS

## 2022-05-05 MED ORDER — VANCOMYCIN HCL 2000 MG/400ML IV SOLN: 2000.0000 mg | INTRAVENOUS | Status: DC

## 2022-05-05 MED ORDER — METRONIDAZOLE 500 MG/100ML IV SOLN
500.0000 mg | Freq: Two times a day (BID) | INTRAVENOUS | Status: DC
  Administered 2022-05-05: 500 mg via INTRAVENOUS
  Filled 2022-05-05: qty 100

## 2022-05-05 NOTE — Progress Notes (Addendum)
Pharmacy Antibiotic Note  Craig Potter Sr. is a 66 y.o. 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 Craig Potter  Pharmacy has been consulted for ceftazidime and vancomycin dosing.  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  Vascular surgery at [facility] recommends the patient be started on vancomycin and ceftazidime and Flagyl until the patient can be transferred over there."  Plan: -Ceftazidime 2 gm IV q8h   -received Vancomycin 1750mg  IV x1 loading dose Will continue with Vancomycin 1000 mg IV Q 12 hrs. Goal AUC 400-550. Expected AUC: 515 SCr used: 1.07 Cmin 15.1    Height: 5\' 11"  (180.3 cm) Weight: 82.6 kg (182 lb) IBW/kg (Calculated) : 75.3  Temp (24hrs), Avg:98.3 F (36.8 C), Min:97.8 F (36.6 C), Max:98.9 F (37.2 C)  Recent Labs  Lab 05/04/22 1233 05/04/22 1701 05/05/22 0503 05/05/22 0654  WBC 12.0*  --  12.0*  --   CREATININE 1.09  --  1.07  --   LATICACIDVEN  --  1.5  --  0.9    Estimated Creatinine Clearance: 73.3 mL/min (by C-G formula based on SCr of 1.07 mg/dL).    No Known Allergies  Antimicrobials this admission: Metronidazole 8/1>> Ceftazidime 8/1>> Vancomycin 8/1>> Dose adjustments this admission:    Microbiology results: 8/1 BCx: NG<12hr   UCx:      Sputum:      MRSA PCR:    Thank you for allowing pharmacy to be a part of this patient's care.  [name] 05/05/2022 8:14 AM

## 2022-05-05 NOTE — ED Notes (Addendum)
Patient is sleeping in no acute distress, Denies complaints at this time . 5 am labs sent . Antibiotics started as ordered. Continuous monitoring. Per secretary [facility] coordinator informed her patient should be able to get a bed around noon today.

## 2022-05-05 NOTE — Plan of Care (Addendum)
This RN  just laid the facts out for this patient and he now understands a little more of what's going on and the risks at hand.  He has agreed to stay and NOT leave AMA "for now" [facility] notified and will continue to seek a bed at [facility] as possible.  [facility] vascular has excpeted patient but they currently do not have a bed to offer.    1329 - Well - patient tried, but Just left AMA as originally planned.  His plan is to make his way over to [facility] ED and "get right in."  I did inform him that it's not usually that easy, but decided to leave anyway.  - Hosp informed

## 2022-05-05 NOTE — ED Notes (Signed)
Call made to [facility] Transfer center @ 0405 for an update per Amy at this time still no bed will have discharges later today advised to call back around 12 noon today.

## 2022-05-05 NOTE — ED Notes (Signed)
Received patient in no acute distress saturating at 90%, patient placed on 3L of o2, now saturating at 98%. Complains of pain , patient medicated as ordered PRN .Flagyl given as ordered . Continuing monitoring.

## 2022-05-05 NOTE — ED Notes (Signed)
Patient states he would like to go home since we are not doing anything for him. States he was under the impression that he was supposed to be transferred to [facility] last night. Advised patient that I understood the frustration of waiting and that we are giving antibiotics. Told patient that I would reach out to MD here to see if there was any changes. Patient agreeable. MD paged and response was that if patient wanted to go then [facility] forms would need to be completed. Patient made aware. Handoff report given to next shift nurse on concerns.

## 2022-05-05 NOTE — Progress Notes (Signed)
S: Craig Potter is a 66 year old male with a previous medical history of coronary artery disease, hypertension and an aortobifemoral bypass who presented to [facility] on 05/04/2022 complaining of dysuria over the last month.  He also had abdominal pain and discomfort.  This is the third time he is presented.  The pain is typically in the periumbilical area and it radiates towards the back.  There has been some fever and chills with nausea, decreased appetite with no diarrhea.  O: CT scan on 04/21/2022 showed a fluid correction around the aortobifemoral bypass graft measuring 2.4 x 1.7 cm.  Updated scan on 05/04/2022 shows significant growth to 3.2 x 2.8 cm.  Noted proximal occlusion of the bilateral SFAs.  A: Based upon the fluid growth around the graft there is concern that there is an abscess infection of the graft.  P: The patient's graft was originally done at [facility] and patient was previously followed by vascular surgeons there.  We have recommended that the patient continue care with [facility].  ED staff has reached out and requested a bed transfer and they have accepted the patient.  It is anticipated that the patient would have a bed sometime after noon today.  We have ordered a CT angio with runoff for further evaluation in case [facility] transfer is delayed.  However, based on current scans the patient's aortobifemoral graft will likely need resection with plans for surgical revascularization.  Full consult to follow pending CTA as well as transfer status

## 2022-05-05 NOTE — ED Notes (Signed)
Patient states he going home asked patient to talk with nurse before he leaves patient agrees notified nurse

## 2022-05-05 NOTE — Discharge Summary (Signed)
Physician Discharge Summary  Craig Potter Sr. 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  He also complains of abdominal pain.  Has been on antibiotics at outpatient for his dysuria multiple times and was most recently taking Levaquin up until the day he presented.  He was meant to complete a 10-day course prescribed by his PCP. CT scan in the ED revealed a fluid collection around the aortobifemoral bypass graft which has grown from about 2 weeks ago.  He was placed on antibiotics due to concern for infection and vascular surgery was consulted.  Principal Problem:   Vascular graft infection (HCC) Leukocytosis - WBC count was 23 on 7/19 and improved to 12.0 -Blood cultures negative so far -Has some mid abdominal pain but no back pain -He has received ceftazidime, vancomycin and metronidazole - Vascular surgery evaluated the patient this morning - A transfer to [facility] was recommended by vascular surgery however [facility] did not have any openings to except the patient and stated that he was first on the waiting list - The patient left AGAINST MEDICAL ADVICE late in the morning and stated to the nurse that he plan to go straight to [facility] to get admitted  Active Problems:   Essential hypertension -Carvedilol, amlodipine, HCTZ ordered  Dysuria - UA was negative in the ED yesterday    Body mass index is 25.38 kg/m. Nutrition Status:          Discharge Instructions     Follow-up Information     Craig Monday, MD In 2 days.   Specialty:  Internal Medicine Contact information: [address] [place] [address] [phone]         Craig Scotland, MD In 2 days.   Specialty: Urology Contact information: [address] [place] [address] [phone]                    The results of significant diagnostics from this hospitalization (including imaging, microbiology, ancillary and laboratory) are listed below for reference.    CT ANGIO AO+BIFEM W & OR WO CONTRAST  Result Date: 05/05/2022 CLINICAL DATA:  Vascular graft infection. History of aortobifemoral bypass graft. EXAM: CT ANGIOGRAPHY OF ABDOMINAL AORTA WITH ILIOFEMORAL RUNOFF TECHNIQUE: Multidetector CT imaging of the abdomen, pelvis and lower extremities was performed using the standard protocol during bolus administration of intravenous contrast. Multiplanar CT image reconstructions and MIPs were obtained to evaluate the vascular anatomy. RADIATION DOSE REDUCTION: This exam was performed according to the departmental dose-optimization program which includes automated exposure control, adjustment of the mA and/or kV according to patient size and/or use of iterative reconstruction technique. CONTRAST:  OMNIPAQUE IOHEXOL 350 MG/ML SOLN COMPARISON:  CTA abdomen and pelvis 05/04/2022 and CT abdomen and pelvis 04/21/2022 FINDINGS: VASCULAR Aorta: Distal descending thoracic aorta is normal caliber and patent. There is an infrarenal aortobifemoral bypass graft. The bypass grafts are patent. Proximal abdominal aorta is patent with normal caliber. Again noted is a low-density collection/material along the anterior aspect of the aortic graft near the bifurcation. This collection  roughly measures 2.8 x 2.9 cm. This collection has minimally changed since 05/04/2022 but new or significantly enlarged since 04/21/2022. There is no gas within this perivascular collection. Difficult to exclude wall thickening around the proximal aortobifemoral bypass  graft. Celiac: Patent without evidence of aneurysm, dissection, vasculitis or significant stenosis. SMA: Patent without evidence of aneurysm, dissection, vasculitis or significant stenosis. Renals: Both renal arteries are patent without evidence of aneurysm, dissection, vasculitis, fibromuscular dysplasia or significant stenosis. IMA: Origin is occluded but there is reconstitution through collateral flow. RIGHT Lower Extremity Inflow: Right femoral bypass graft is patent with mild circumferential graft thickening. Outflow: Ectasia of the right common femoral artery related to prior surgery. Right profunda femoral arteries are patent. Right SFA is occluded at the origin. Reconstitution of the proximal popliteal artery. Right popliteal artery is patent. Runoff: Runoff vessels are small but there is evidence of three-vessel runoff. Limited evaluation of the distal anterior tibial artery and distal posterior tibial artery likely related to the timing of the study. LEFT Lower Extremity Inflow: Left femoral graft is patent. Outflow: Left common femoral artery is patent. Early takeoff of the left profunda femoral arteries. Left profunda femoral arteries are patent. Left superficial femoral artery occludes just beyond the origin. Reconstitution in the proximal left popliteal artery. Popliteal artery is small but patent. Runoff: Proximal and mid left anterior tibial artery is patent. Limited evaluation of the distal anterior tibial artery likely related to timing of the study. Tibioperoneal trunk is patent. There may be a late takeoff of the posterior tibial artery which is patent into the distal calf. Limited evaluation of the peroneal artery. Veins: No obvious venous abnormality within the limitations of this arterial phase study. Review of the MIP images confirms the above findings. NON-VASCULAR Lower chest: Visualized lung bases are clear. Hepatobiliary: The gallbladder is mildly distended with contrast. Contrast is  probably from vicarious excretion from recent CT of the abdomen and pelvis. No gross abnormality to the liver but limited evaluation due to incomplete coverage of the liver and arterial phase imaging. Pancreas: Unremarkable. No pancreatic ductal dilatation or surrounding inflammatory changes. Spleen: Spleen is normal for size. Limited detail due to motion artifact and phase of contrast. Adrenals/Urinary Tract: Adrenal glands are not well demonstrated on this examination. Limited evaluation of both kidneys due to excessive motion. Again noted is a large right parapelvic cyst. The cyst does not require dedicated follow-up. Negative for hydronephrosis. Urinary bladder is decompressed with a small amount of contrast. Stomach/Bowel: No appearance of stomach. Evidence for a normal appendix in the right lower quadrant of the abdomen. No evidence for bowel obstruction or focal bowel inflammation. Lymphatic: Small periaortic lymph nodes. Overall, no significant lymph node enlargement in the abdomen or pelvis. Reproductive: Stable appearance of the prostate. Other: Negative for free air or free fluid. Small periumbilical hernia containing fat. Musculoskeletal: No acute bone abnormality. IMPRESSION: VASCULAR 1. Aortobifemoral bypass graft remains patent. Again noted is a low-density structure or collection along the anterior aspect of the graft near the bifurcation. This area has clearly enlarged since 04/21/2022 and raises concern for a perigraft infection. 2. Bilateral outflow disease. Bilateral superficial femoral arteries are occluded. Reconstitution of bilateral popliteal arteries. 3. Limited evaluation of the runoff vessels bilaterally. Probable three-vessel runoff in the right lower extremity. Proximal left runoff vessels are patent. Cannot confidently evaluate the distal left runoff vessels. NON-VASCULAR 1. Limited evaluation of the intra-abdominal structures due to motion artifact. 2. Low-density collection along the  anterior aspect of the  aortic bypass is similar to the recent comparison examination and concerning for a perigraft infection. No other significant inflammatory changes in the abdomen or pelvis. Electronically Signed   By: Adam  HennRicharda Overlie M.D.   On: 05/05/2022 11:09   CT Angio Abd/Pel W and/or Wo Contrast  Result Date: 05/04/2022 CLINICAL DATA:  Aortic aneurysm (AAA), surveillance eval fluid at distal aorta bypass graft, post aortobifem 11/07/2003 EXAM: CTA ABDOMEN AND PELVIS WITHOUT AND WITH CONTRAST TECHNIQUE: Multidetector CT imaging of the abdomen and pelvis was performed using the standard protocol during bolus administration of intravenous contrast. Multiplanar reconstructed images and MIPs were obtained and reviewed to evaluate the vascular anatomy. RADIATION DOSE REDUCTION: This exam was performed according to the departmental dose-optimization program which includes automated exposure control, adjustment of the mA and/or kV according to patient size and/or use of iterative reconstruction technique. CONTRAST:  100mL OMNIPAQUE IOHEXOL 350 MG/ML SOLN COMPARISON:  Noncontrast study from earlier the same day, CT 04/21/2022 FINDINGS: VASCULAR Aorta: Mild calcified atheromatous plaque in the suprarenal and juxtarenal segments. Post infrarenal aortobifem graft. No evidence of pseudoaneurysm or extravasation. Persistent perigraft fluid since 04/21/2022. Some increase in a loculated fluid attenuation at the anterior margin of the graft at the level of the native aortic bifurcation, approximately 3 x 2.6 cm maximum transverse dimensions (previously 2.2 x 1.3). Celiac: Patent without evidence of aneurysm, dissection, vasculitis or significant stenosis. SMA: Minimal nonocclusive plaque. No stenosis. Classic distal branch anatomy. Renals: Single bilaterally. Scattered calcified plaque without high-grade stenosis. IMA: Origin occlusion/ligation, reconstituted distally by visceral collaterals. Inflow: Native iliac arterial  systems are occluded. Patent distal graft anastomoses to the common femoral arteries. Proximal Outflow: Proximal occlusion of the left SFA. Proximal occlusion of the right SFA. Bilateral deep femoral branches patent. Veins: No obvious venous abnormality within the limitations of this arterial phase study. Review of the MIP images confirms the above findings. NON-VASCULAR Lower chest: No pleural or pericardial effusion. Pulmonary emphysema. Hepatobiliary: No focal liver abnormality is seen. No gallstones, gallbladder wall thickening, or biliary dilatation. Pancreas: Unremarkable. No pancreatic ductal dilatation or surrounding inflammatory changes. Spleen: Normal in size without focal abnormality. Adrenals/Urinary Tract: No adrenal mass. Left kidney unremarkable. Stable large right parapelvic renal cyst; no follow-up recommended. Urinary bladder incompletely distended. Stomach/Bowel: Stomach and small bowel are decompressed. Normal appendix. The colon is incompletely distended. Multiple distal descending and sigmoid diverticula; no significant adjacent inflammatory change. Lymphatic: No abdominal or pelvic adenopathy. Reproductive: Prostate is unremarkable. Other: No ascites.  No free air. Musculoskeletal: Bilateral hip DJD. Multilevel spondylitic change in the lower thoracic and lumbar spine. No acute findings. IMPRESSION: VASCULAR 1. Enlarging fluid collection around the aortobifem graft. Correlate with any clinical/laboratory evidence of graft infection. 2. Patent aortobifem graft.  Bilateral proximal SFA occlusion. NON-VASCULAR 1. Descending and sigmoid diverticulosis. Electronically Signed   By: Corlis Leak  Hassell M.D.   On: 05/04/2022 16:07   CT Renal Stone Study  Result Date: 05/04/2022 CLINICAL DATA:  Bladder infection symptoms for 1 month. Lower abdominal pain. EXAM: CT ABDOMEN AND PELVIS WITHOUT CONTRAST TECHNIQUE: Multidetector CT imaging of the abdomen and pelvis was performed following the standard protocol  without IV contrast. RADIATION DOSE REDUCTION: This exam was performed according to the departmental dose-optimization program which includes automated exposure control, adjustment of the mA and/or kV according to patient size and/or use of iterative reconstruction technique. COMPARISON:  04/21/2022 FINDINGS: Lower chest: Motion degradation. Grossly clear lung bases. Normal heart size without pericardial or pleural effusion. Hepatobiliary: Motion degradation continuing into  the upper abdomen. Grossly normal noncontrast appearance of the liver, gallbladder, biliary tract. Pancreas: Normal, without mass or ductal dilatation. Spleen: Normal in size, without focal abnormality. Adrenals/Urinary Tract: Normal adrenal glands. Right renal sinus cyst measures 4.0 x 4.3 cm on 33/2 . In the absence of clinically indicated signs/symptoms require(s) no independent follow-up. No urinary tract calculi or hydronephrosis. No hydroureter or bladder calculi. Stomach/Bowel: Normal stomach, without wall thickening. Scattered colonic diverticula. Normal terminal ileum and appendix. Normal small bowel. Vascular/Lymphatic: Aortic atherosclerosis. Suboptimal evaluation of aortic/bifem bypass. Fluid about the anterior aspect of the graft at the level of the aortic bifurcation measures 3.2 x 2.8 cm on 47/2 versus 2.4 x 1.7 cm on the prior exam (when remeasured). No abdominopelvic adenopathy. Reproductive: Normal prostate. Other: No significant free fluid.  No free intraperitoneal air. Musculoskeletal: Degenerative partial fusion of the bilateral sacroiliac joints. Disc bulges at L4-5 and less so L5-S1. IMPRESSION: 1. No urinary tract calculi or hydronephrosis. Motion degraded exam which is otherwise limited by stone study technique. 2. Status post aortic/bifem bypass. Increased fluid density about the anterior aspect of the distal aortic portion of the bypass graft. Consider dedicated CTA to evaluate for infection or other complication. 3.   Aortic Atherosclerosis (ICD10-I70.0). Electronically Signed   By: Jeronimo Greaves M.D.   On: 05/04/2022 14:37   DG Chest 2 View  Result Date: 04/21/2022 CLINICAL DATA:  Nausea and vomiting with lower abdominal pain and weakness x2 weeks. EXAM: CHEST - 2 VIEW COMPARISON:  Radiograph April 07, 2022 FINDINGS: The heart size and mediastinal contours are within normal limits. Similar chronic appearing coarsening of the interstitial markings. No focal airspace consolidation. No pleural effusion. No pneumothorax. The visualized skeletal structures are unremarkable. IMPRESSION: No acute cardiopulmonary disease. Electronically Signed   By: Maudry Mayhew M.D.   On: 04/21/2022 10:34   CT ABDOMEN PELVIS W CONTRAST  Result Date: 04/21/2022 CLINICAL DATA:  Acute lower abdominal pain. EXAM: CT ABDOMEN AND PELVIS WITH CONTRAST TECHNIQUE: Multidetector CT imaging of the abdomen and pelvis was performed using the standard protocol following bolus administration of intravenous contrast. RADIATION DOSE REDUCTION: This exam was performed according to the departmental dose-optimization program which includes automated exposure control, adjustment of the mA and/or kV according to patient size and/or use of iterative reconstruction technique. CONTRAST:  OMNIPAQUE IOHEXOL 300 MG/ML  SOLN COMPARISON:  None Available. FINDINGS: Lower chest: No acute abnormality. Hepatobiliary: No gallstones or biliary dilatation is noted. Small rounded low density is noted in left hepatic lobe most consistent with cyst. Pancreas: Unremarkable. No pancreatic ductal dilatation or surrounding inflammatory changes. Spleen: Normal in size without focal abnormality. Adrenals/Urinary Tract: Adrenal glands appear normal. 4.8 cm right renal cyst is noted for which no further follow-up is required. No hydronephrosis or renal obstruction is noted. Urinary bladder is unremarkable. Stomach/Bowel: Stomach is within normal limits. Appendix appears normal. No  evidence of bowel wall thickening, distention, or inflammatory changes. Vascular/Lymphatic: Status post aortobifemoral graft placement. No adenopathy is noted. Reproductive: Prostate is unremarkable. Other: No abdominal wall hernia or abnormality. No abdominopelvic ascites. Musculoskeletal: No acute or significant osseous findings. IMPRESSION: No acute abnormality seen in the abdomen or pelvis. Electronically Signed   By: Lupita Raider M.D.   On: 04/21/2022 08:18   DG Chest 2 View  Result Date: 04/07/2022 CLINICAL DATA:  Weakness and shortness of breath. EXAM: CHEST - 2 VIEW COMPARISON:  No comparison studies available. FINDINGS: The heart size and mediastinal contours are within normal limits.  Both lungs are clear. Interstitial markings are diffusely coarsened with chronic features. The visualized skeletal structures are unremarkable. IMPRESSION: No active cardiopulmonary disease. Electronically Signed   By: Kennith Center M.D.   On: 04/07/2022 10:51   Labs:   Basic Metabolic Panel: Recent Labs  Lab 05/04/22 1233 05/05/22 0503  NA 134* 135  K 3.9 3.9  CL 98 99  CO2 26 26  GLUCOSE 84 105*  BUN 14 12  CREATININE 1.09 1.07  CALCIUM 8.9 8.7*     CBC: Recent Labs  Lab 05/04/22 1233 05/05/22 0503  WBC 12.0* 12.0*  NEUTROABS 8.3*  --   HGB 15.4 14.3  HCT 47.3 44.6  MCV 85.8 86.6  PLT 329 318         SIGNED:   Calvert Cantor, MD  [facility] 05/05/2022, 3:49 PM

## 2022-05-06 DIAGNOSIS — F17210 Nicotine dependence, cigarettes, uncomplicated: Secondary | ICD-10-CM | POA: Diagnosis not present

## 2022-05-06 DIAGNOSIS — I10 Essential (primary) hypertension: Secondary | ICD-10-CM | POA: Diagnosis not present

## 2022-05-06 DIAGNOSIS — T82898A Other specified complication of vascular prosthetic devices, implants and grafts, initial encounter: Secondary | ICD-10-CM | POA: Diagnosis not present

## 2022-05-06 DIAGNOSIS — E785 Hyperlipidemia, unspecified: Secondary | ICD-10-CM | POA: Diagnosis not present

## 2022-05-06 DIAGNOSIS — I2510 Atherosclerotic heart disease of native coronary artery without angina pectoris: Secondary | ICD-10-CM | POA: Diagnosis not present

## 2022-05-06 DIAGNOSIS — I739 Peripheral vascular disease, unspecified: Secondary | ICD-10-CM | POA: Diagnosis not present

## 2022-05-06 DIAGNOSIS — T827XXA Infection and inflammatory reaction due to other cardiac and vascular devices, implants and grafts, initial encounter: Secondary | ICD-10-CM | POA: Diagnosis not present

## 2022-05-07 DIAGNOSIS — E785 Hyperlipidemia, unspecified: Secondary | ICD-10-CM | POA: Diagnosis not present

## 2022-05-07 DIAGNOSIS — T82898A Other specified complication of vascular prosthetic devices, implants and grafts, initial encounter: Secondary | ICD-10-CM | POA: Diagnosis not present

## 2022-05-07 DIAGNOSIS — I739 Peripheral vascular disease, unspecified: Secondary | ICD-10-CM | POA: Diagnosis not present

## 2022-05-07 DIAGNOSIS — I10 Essential (primary) hypertension: Secondary | ICD-10-CM | POA: Diagnosis not present

## 2022-05-07 DIAGNOSIS — I2510 Atherosclerotic heart disease of native coronary artery without angina pectoris: Secondary | ICD-10-CM | POA: Diagnosis not present

## 2022-05-07 DIAGNOSIS — T827XXA Infection and inflammatory reaction due to other cardiac and vascular devices, implants and grafts, initial encounter: Secondary | ICD-10-CM | POA: Diagnosis not present

## 2022-05-09 LAB — CULTURE, BLOOD (ROUTINE X 2)
Culture: NO GROWTH
Culture: NO GROWTH

## 2022-05-12 DIAGNOSIS — Z9889 Other specified postprocedural states: Secondary | ICD-10-CM | POA: Diagnosis not present

## 2022-05-12 DIAGNOSIS — I2510 Atherosclerotic heart disease of native coronary artery without angina pectoris: Secondary | ICD-10-CM | POA: Diagnosis not present

## 2022-05-12 DIAGNOSIS — T827XXA Infection and inflammatory reaction due to other cardiac and vascular devices, implants and grafts, initial encounter: Secondary | ICD-10-CM | POA: Diagnosis not present

## 2022-05-13 DIAGNOSIS — T827XXD Infection and inflammatory reaction due to other cardiac and vascular devices, implants and grafts, subsequent encounter: Secondary | ICD-10-CM | POA: Diagnosis not present

## 2022-05-13 DIAGNOSIS — I2510 Atherosclerotic heart disease of native coronary artery without angina pectoris: Secondary | ICD-10-CM | POA: Diagnosis not present

## 2022-05-13 DIAGNOSIS — Z9889 Other specified postprocedural states: Secondary | ICD-10-CM | POA: Diagnosis not present

## 2022-05-16 DIAGNOSIS — I2510 Atherosclerotic heart disease of native coronary artery without angina pectoris: Secondary | ICD-10-CM | POA: Diagnosis not present

## 2022-05-21 DIAGNOSIS — T827XXA Infection and inflammatory reaction due to other cardiac and vascular devices, implants and grafts, initial encounter: Secondary | ICD-10-CM | POA: Diagnosis not present

## 2022-05-24 DIAGNOSIS — I252 Old myocardial infarction: Secondary | ICD-10-CM | POA: Diagnosis not present

## 2022-05-24 DIAGNOSIS — I739 Peripheral vascular disease, unspecified: Secondary | ICD-10-CM | POA: Diagnosis not present

## 2022-05-24 DIAGNOSIS — B954 Other streptococcus as the cause of diseases classified elsewhere: Secondary | ICD-10-CM | POA: Diagnosis not present

## 2022-05-24 DIAGNOSIS — Z452 Encounter for adjustment and management of vascular access device: Secondary | ICD-10-CM | POA: Diagnosis not present

## 2022-05-24 DIAGNOSIS — B9689 Other specified bacterial agents as the cause of diseases classified elsewhere: Secondary | ICD-10-CM | POA: Diagnosis not present

## 2022-05-24 DIAGNOSIS — I2510 Atherosclerotic heart disease of native coronary artery without angina pectoris: Secondary | ICD-10-CM | POA: Diagnosis not present

## 2022-05-24 DIAGNOSIS — I10 Essential (primary) hypertension: Secondary | ICD-10-CM | POA: Diagnosis not present

## 2022-05-24 DIAGNOSIS — T827XXA Infection and inflammatory reaction due to other cardiac and vascular devices, implants and grafts, initial encounter: Secondary | ICD-10-CM | POA: Diagnosis not present

## 2022-05-24 DIAGNOSIS — E785 Hyperlipidemia, unspecified: Secondary | ICD-10-CM | POA: Diagnosis not present

## 2022-05-26 ENCOUNTER — Other Ambulatory Visit: Payer: Self-pay

## 2022-05-26 NOTE — Patient Outreach (Signed)
[facility]) Care Management  05/26/2022  Craig Potter Sr. Jul 15, 1956 [id number]   Referral Date: 05/26/22 Referral Source: PING Date of Discharge:05/21/22 Facility: [facility] Insurance: [facility]  Outreach attempt: Telephone call to patient for [facility] call. Patient reports he is doing well. He lives in the home with his sons. He is independet with care.  Medication reviewed.  Patient has home health visits weekly for IV assessment.  Patient on antibiotic for infected grafts.  Patient states that groin wounds show no signs of infection. Reviewed signs of infection and to notify physician for changes.  He verbalized understanding.      Appointments: Patient has follow up with physician at [facility] and no problems with transportation  Consent: RN CM reviewed [facility] services with patient. Patient declined services.  Plan: RN CM will close case.     Bary Leriche, RN, MSN [facility] Care Management Care Management Coordinator Direct Line [phone] Toll Free: [phone]  Fax: [phone]

## 2022-05-27 ENCOUNTER — Emergency Department
Admission: EM | Admit: 2022-05-27 | Discharge: 2022-05-27 | Disposition: A | Discharge status: Home / Self Care | Payer: Medicare HMO | Attending: Emergency Medicine | Admitting: Emergency Medicine

## 2022-05-27 ENCOUNTER — Other Ambulatory Visit: Payer: Self-pay

## 2022-05-27 DIAGNOSIS — E860 Dehydration: Secondary | ICD-10-CM | POA: Insufficient documentation

## 2022-05-27 DIAGNOSIS — I10 Essential (primary) hypertension: Secondary | ICD-10-CM | POA: Diagnosis not present

## 2022-05-27 DIAGNOSIS — R42 Dizziness and giddiness: Secondary | ICD-10-CM | POA: Diagnosis present

## 2022-05-27 DIAGNOSIS — T8189XA Other complications of procedures, not elsewhere classified, initial encounter: Secondary | ICD-10-CM | POA: Diagnosis not present

## 2022-05-27 DIAGNOSIS — Z20822 Contact with and (suspected) exposure to covid-19: Secondary | ICD-10-CM | POA: Diagnosis not present

## 2022-05-27 DIAGNOSIS — I959 Hypotension, unspecified: Secondary | ICD-10-CM | POA: Diagnosis not present

## 2022-05-27 LAB — CBC WITH DIFFERENTIAL/PLATELET
Abs Immature Granulocytes: 0.04 10*3/uL (ref 0.00–0.07)
Basophils Absolute: 0.1 10*3/uL (ref 0.0–0.1)
Basophils Relative: 1 %
Eosinophils Absolute: 0.1 10*3/uL (ref 0.0–0.5)
Eosinophils Relative: 1 %
HCT: 34.2 % — ABNORMAL LOW (ref 39.0–52.0)
Hemoglobin: 10.5 g/dL — ABNORMAL LOW (ref 13.0–17.0)
Immature Granulocytes: 0 %
Lymphocytes Relative: 23 %
Lymphs Abs: 2.3 10*3/uL (ref 0.7–4.0)
MCH: 27.2 pg (ref 26.0–34.0)
MCHC: 30.7 g/dL (ref 30.0–36.0)
MCV: 88.6 fL (ref 80.0–100.0)
Monocytes Absolute: 1.4 10*3/uL — ABNORMAL HIGH (ref 0.1–1.0)
Monocytes Relative: 14 %
Neutro Abs: 6.2 10*3/uL (ref 1.7–7.7)
Neutrophils Relative %: 61 %
Platelets: 760 10*3/uL — ABNORMAL HIGH (ref 150–400)
RBC: 3.86 MIL/uL — ABNORMAL LOW (ref 4.22–5.81)
RDW: 15.2 % (ref 11.5–15.5)
WBC: 10 10*3/uL (ref 4.0–10.5)
nRBC: 0 % (ref 0.0–0.2)

## 2022-05-27 LAB — URINALYSIS, COMPLETE (UACMP) WITH MICROSCOPIC
Bilirubin Urine: NEGATIVE
Glucose, UA: NEGATIVE mg/dL
Hgb urine dipstick: NEGATIVE
Ketones, ur: NEGATIVE mg/dL
Leukocytes,Ua: NEGATIVE
Nitrite: NEGATIVE
Protein, ur: 30 mg/dL — AB
Specific Gravity, Urine: 1.018 (ref 1.005–1.030)
pH: 5 (ref 5.0–8.0)

## 2022-05-27 LAB — BASIC METABOLIC PANEL
Anion gap: 11 (ref 5–15)
BUN: 12 mg/dL (ref 8–23)
CO2: 22 mmol/L (ref 22–32)
Calcium: 8.5 mg/dL — ABNORMAL LOW (ref 8.9–10.3)
Chloride: 99 mmol/L (ref 98–111)
Creatinine, Ser: 1.23 mg/dL (ref 0.61–1.24)
GFR, Estimated: >60 mL/min (ref >60)
Glucose, Bld: 99 mg/dL (ref 70–99)
Potassium: 4.7 mmol/L (ref 3.5–5.1)
Sodium: 132 mmol/L — ABNORMAL LOW (ref 135–145)

## 2022-05-27 LAB — SARS CORONAVIRUS 2 BY RT PCR: SARS Coronavirus 2 by RT PCR: NEGATIVE

## 2022-05-27 LAB — LACTIC ACID, PLASMA: Lactic Acid, Venous: 1.7 mmol/L (ref 0.5–1.9)

## 2022-05-27 MED ORDER — SODIUM CHLORIDE 0.9 % IV BOLUS
1000.0000 mL | Freq: Once | INTRAVENOUS | Status: AC
  Administered 2022-05-27: 1000 mL via INTRAVENOUS

## 2022-05-27 NOTE — ED Provider Notes (Signed)
Patient's blood pressure did improve after IV fluids.  He was able to get up and walk around without any complaints.  Did state he felt comfortable being discharged home.  At this time given blood pressure improvement I think it is reasonable.  Did encourage patient to follow-up with doctors tomorrow for blood pressure recheck.   Phineas Semen, MD 05/27/22 [phone]

## 2022-05-27 NOTE — ED Triage Notes (Addendum)
Pt come with c/o dizziness and hypotension. Pt states he had abdominal surgery and went for follow up., pt state at appt he was hypotensive. Pt states some dizziness but denies any pain.   Pt has picc line in place and currently has antibiotics infusing.

## 2022-05-27 NOTE — Discharge Instructions (Signed)
Please seek medical attention for any high fevers, chest pain, shortness of breath, change in behavior, persistent vomiting, bloody stool or any other new or concerning symptoms.  

## 2022-05-27 NOTE — ED Provider Notes (Signed)
[facility] Provider Note    Event Date/Time   First MD Initiated Contact with Patient 05/27/22 1434     (approximate)  History   Chief Complaint: Dizziness  HPI  Craig SPRINGER Sr. is a 66 y.o. male with a past medical history of hypertension, hyperlipidemia, MI, recent laparotomy, presents to the emergency department for dizziness and low blood pressure.  According to the chart review patient was seen at [facility] and ultimately transferred to [facility] for possible infected aortobifem graft.  Per [facility] records on 05/12/2022 patient had a surgery in which the infected aortobifem bypass graft was removed and replaced with a rifampin soaked graft.  Patient was discharged with a PICC line for daily antibiotics.  Patient states he has not been eating or drinking much since his discharge from the hospital.  Patient went to his routine follow-up appointment today at [facility].  Was found to have low blood pressure patient states he is still feeling dizzy at times.  They asked the patient to go to the emergency department.  Patient states he does not live in [place] so he drove back to [place] where he lives to go to our emergency department.  Patient denies any abdominal pain.  Normal bowel movement yesterday, denies any nausea or vomiting.  No chest pain or shortness of breath.  No fever.  Physical Exam   Triage Vital Signs: ED Triage Vitals  Enc Vitals Group     BP 05/27/22 1329 (!) 85/57     Pulse Rate 05/27/22 1329 (!) 114     Resp 05/27/22 1329 18     Temp 05/27/22 1329 (!) 97.5 F (36.4 C)     Temp Source 05/27/22 1329 Oral     SpO2 05/27/22 1329 100 %     Weight --      Height --      Head Circumference --      Peak Flow --      Pain Score 05/27/22 1327 0     Pain Loc --      Pain Edu? --      Excl. in GC? --     Most recent vital signs: Vitals:   05/27/22 1329 05/27/22 1334  BP: (!) 85/57 (!) 85/56  Pulse: (!) 114 (!) 117  Resp: 18 20  Temp:  (!) 97.5 F (36.4 C) (!) 97.5 F (36.4 C)  SpO2: 100% 95%    General: Awake, no distress.  CV:  Good peripheral perfusion.  Regular rate and rhythm  Resp:  Normal effort.  Equal breath sounds bilaterally.  Abd:  Large laparotomy incision with staples in place no sign of dehiscence drainage or abscess.  Abdomen is benign and nontender throughout.   ED Results / Procedures / Treatments   EKG  EKG viewed and interpreted by myself shows what appears to be sinus or accelerated junctional rhythm at 116 bpm with a narrow QRS, normal axis, normal intervals with no concerning ST changes.    MEDICATIONS ORDERED IN ED: Medications  sodium chloride 0.9 % bolus 1,000 mL (has no administration in time range)  sodium chloride 0.9 % bolus 1,000 mL (1,000 mLs Intravenous New Bag/Given 05/27/22 1438)     IMPRESSION / MDM / ASSESSMENT AND PLAN / ED COURSE  I reviewed the triage vital signs and the nursing notes.  Patient's presentation is most consistent with acute presentation with potential threat to life or bodily function.  Patient presents emergency department for low  blood pressure and dizziness found at his routine surgery follow-up.  Here the patient is hypotensive 85/56 as well as tachycardic at 117 bpm.  We will dose IV fluids we will check labs and continue to closely monitor.  Patient has a large abdominal incision that still has staples in place appears well no sign of infection no drainage.  Nontender abdomen.  No fever.  CBC shows no concerning findings.  Chemistry is normal, lactate is 1.7.  IV fluids infusing.  Blood pressure improving currently 93/72, heart rate improving currently 68 bpm.  Urinalysis pending.  COVID pending.  Patient care signed out to oncoming provider.  FINAL CLINICAL IMPRESSION(S) / ED DIAGNOSES   Hypotension Dehydration    Note:  This document was prepared using Dragon voice recognition software and may include unintentional dictation errors.    Minna Antis, MD 05/27/22 1452

## 2022-05-27 NOTE — ED Provider Triage Note (Signed)
Emergency Medicine Provider Triage Evaluation Note  Craig CROPP Sr. , a 66 y.o. male  was evaluated in triage.  Pt complains of hypotension.  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  Had a follow-up appointment today, and he has reportedly not been performing dressing changes.  He was noted to have hypotension at the postop clinic today as they recommended hydration and advised him to either follow-up with his PCP or ER.  Patient denies fevers or pain at any of his surgical sites  Review of Systems  Positive: dizzy Negative: Pain, fever  Physical Exam  There were no vitals taken for this visit. Gen:   Awake, no distress   Resp:  Normal effort  MSK:   Moves extremities without difficulty  Other:  Incision site c/d/i  Medical Decision Making  Medically screening exam initiated at 1:24 PM.  Appropriate orders placed.  Craig Venne Delconte Sr. was informed that the remainder of the evaluation will be completed by another provider, this initial triage assessment does not replace that evaluation, and the importance of remaining in the ED until their evaluation is complete.     Jackelyn Hoehn, PA-C 05/27/22 1329

## 2022-05-28 DIAGNOSIS — T827XXA Infection and inflammatory reaction due to other cardiac and vascular devices, implants and grafts, initial encounter: Secondary | ICD-10-CM | POA: Diagnosis not present

## 2022-05-31 DIAGNOSIS — T827XXA Infection and inflammatory reaction due to other cardiac and vascular devices, implants and grafts, initial encounter: Secondary | ICD-10-CM | POA: Diagnosis not present

## 2022-06-01 LAB — CULTURE, BLOOD (ROUTINE X 2)
Culture: NO GROWTH
Culture: NO GROWTH
Special Requests: ADEQUATE
Special Requests: ADEQUATE

## 2022-06-02 DIAGNOSIS — E785 Hyperlipidemia, unspecified: Secondary | ICD-10-CM | POA: Diagnosis not present

## 2022-06-02 DIAGNOSIS — S335XXA Sprain of ligaments of lumbar spine, initial encounter: Secondary | ICD-10-CM | POA: Diagnosis not present

## 2022-06-02 DIAGNOSIS — B370 Candidal stomatitis: Secondary | ICD-10-CM | POA: Diagnosis not present

## 2022-06-02 DIAGNOSIS — I10 Essential (primary) hypertension: Secondary | ICD-10-CM | POA: Diagnosis not present

## 2022-06-02 DIAGNOSIS — F17200 Nicotine dependence, unspecified, uncomplicated: Secondary | ICD-10-CM | POA: Diagnosis not present

## 2022-06-02 DIAGNOSIS — I2510 Atherosclerotic heart disease of native coronary artery without angina pectoris: Secondary | ICD-10-CM | POA: Diagnosis not present

## 2022-06-02 DIAGNOSIS — D751 Secondary polycythemia: Secondary | ICD-10-CM | POA: Diagnosis not present

## 2022-06-02 DIAGNOSIS — R7303 Prediabetes: Secondary | ICD-10-CM | POA: Diagnosis not present

## 2022-06-02 DIAGNOSIS — M542 Cervicalgia: Secondary | ICD-10-CM | POA: Diagnosis not present

## 2022-06-04 DIAGNOSIS — T827XXD Infection and inflammatory reaction due to other cardiac and vascular devices, implants and grafts, subsequent encounter: Secondary | ICD-10-CM | POA: Diagnosis not present

## 2022-06-04 DIAGNOSIS — Z792 Long term (current) use of antibiotics: Secondary | ICD-10-CM | POA: Diagnosis not present

## 2022-06-04 DIAGNOSIS — T827XXA Infection and inflammatory reaction due to other cardiac and vascular devices, implants and grafts, initial encounter: Secondary | ICD-10-CM | POA: Diagnosis not present

## 2022-06-08 DIAGNOSIS — Z09 Encounter for follow-up examination after completed treatment for conditions other than malignant neoplasm: Secondary | ICD-10-CM | POA: Diagnosis not present

## 2022-06-08 DIAGNOSIS — T827XXA Infection and inflammatory reaction due to other cardiac and vascular devices, implants and grafts, initial encounter: Secondary | ICD-10-CM | POA: Diagnosis not present

## 2022-06-08 DIAGNOSIS — T827XXD Infection and inflammatory reaction due to other cardiac and vascular devices, implants and grafts, subsequent encounter: Secondary | ICD-10-CM | POA: Diagnosis not present

## 2022-06-12 DIAGNOSIS — T827XXA Infection and inflammatory reaction due to other cardiac and vascular devices, implants and grafts, initial encounter: Secondary | ICD-10-CM | POA: Diagnosis not present

## 2022-06-14 DIAGNOSIS — T827XXA Infection and inflammatory reaction due to other cardiac and vascular devices, implants and grafts, initial encounter: Secondary | ICD-10-CM | POA: Diagnosis not present

## 2022-06-15 IMAGING — CT CT CHEST LUNG CANCER SCREENING LOW DOSE W/O CM
2 of 5 series · 15 of 40 positions shown, 18 images · non-contrast
Comparison: None.

CLINICAL DATA: t 65-year-old male with 46 pack-year history of
smoking. Lung cancer screening.



[Series 3: lung 1.00 · axial · 0.67mm/px · z∈[-1245,-937]mm · 12 of 340 slices shown, 15 images]
[im 16/340  mediastinal]
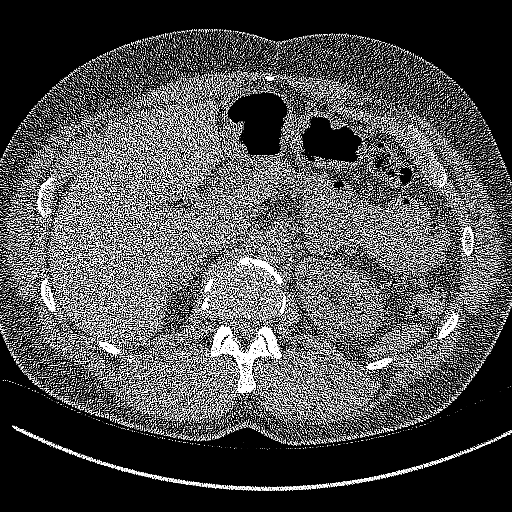
[im 16/340  lung]
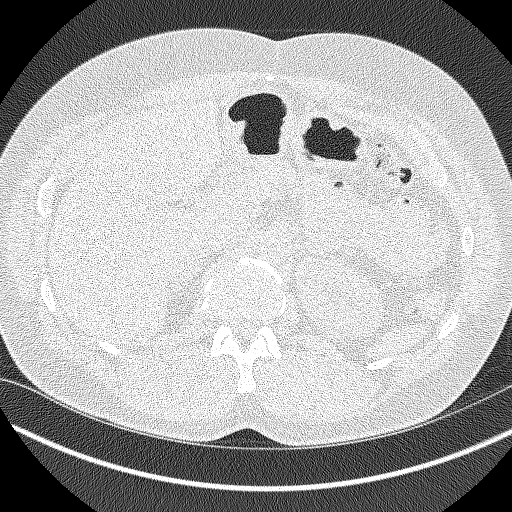
[im 47/340  lung]
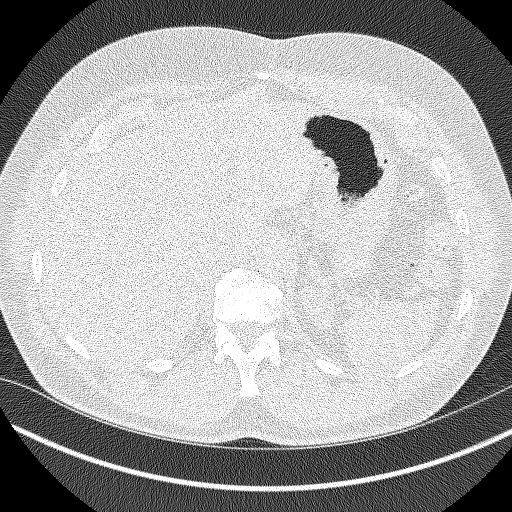
[im 78/340  lung]
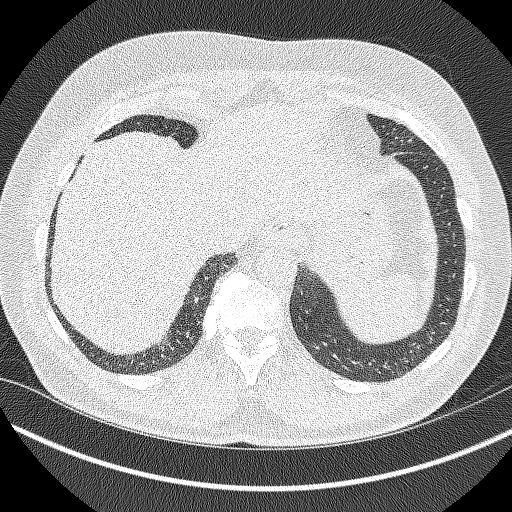
[im 108/340  lung]
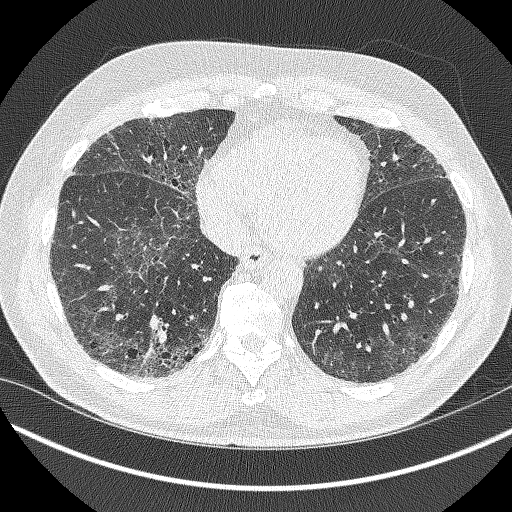
[im 124/340  mediastinal]
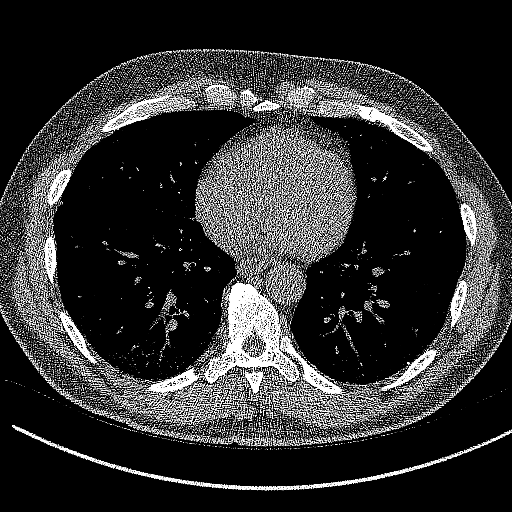
[im 124/340  lung]
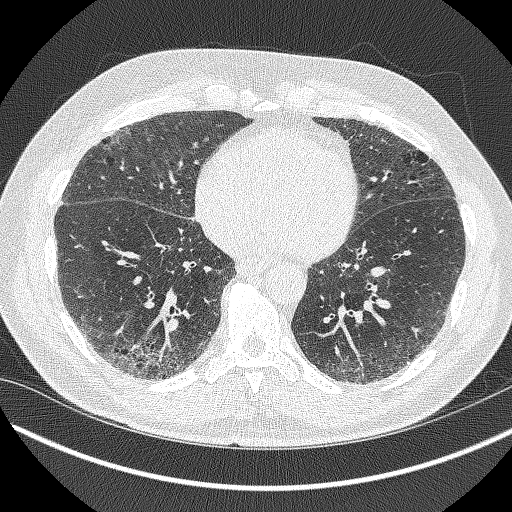
[im 155/340  lung]
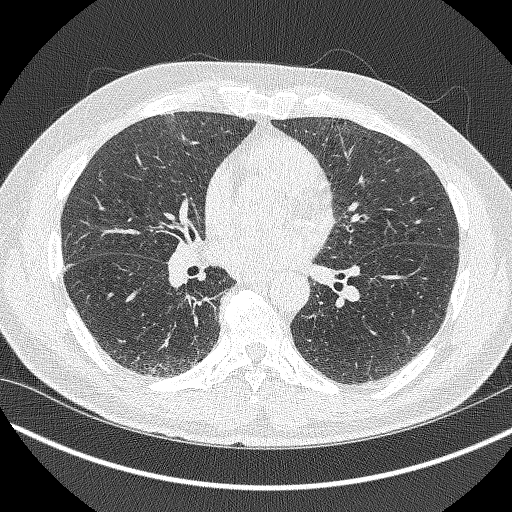
[im 185/340  lung]
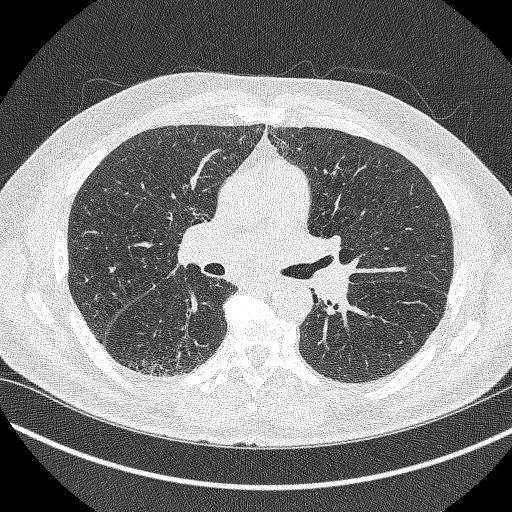
[im 216/340  lung]
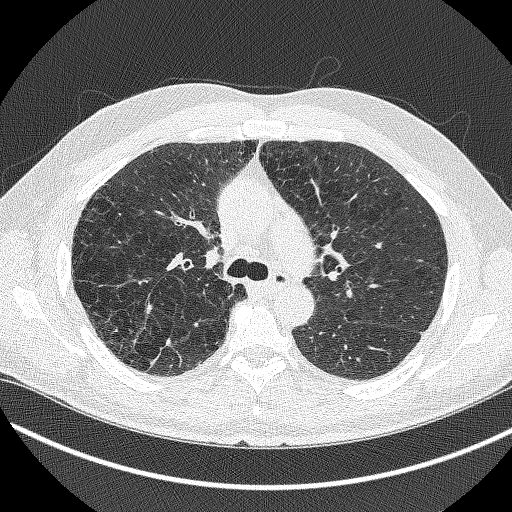
[im 232/340  mediastinal]
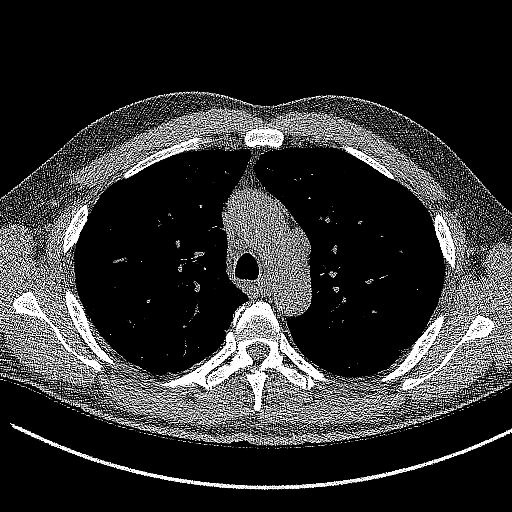
[im 232/340  lung]
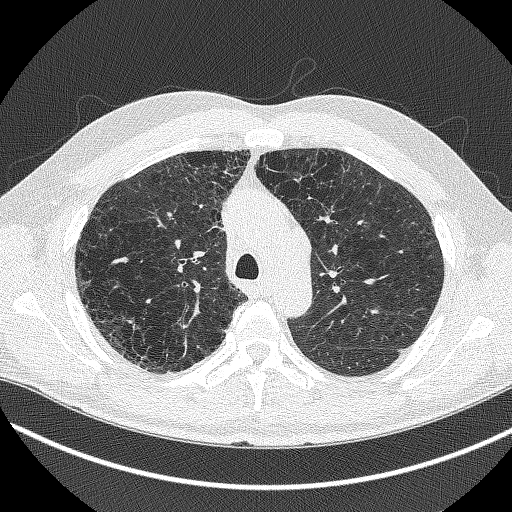
[im 262/340  lung]
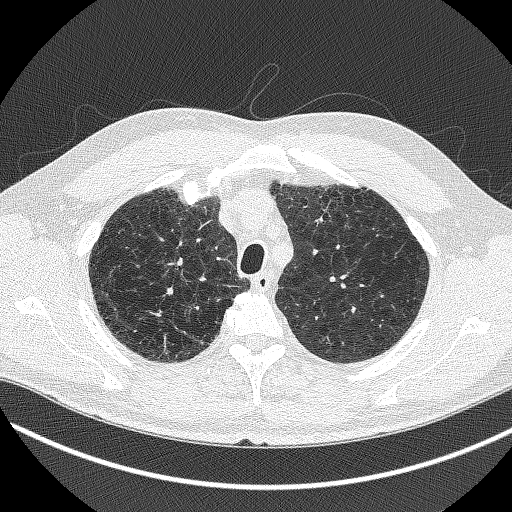
[im 293/340  lung]
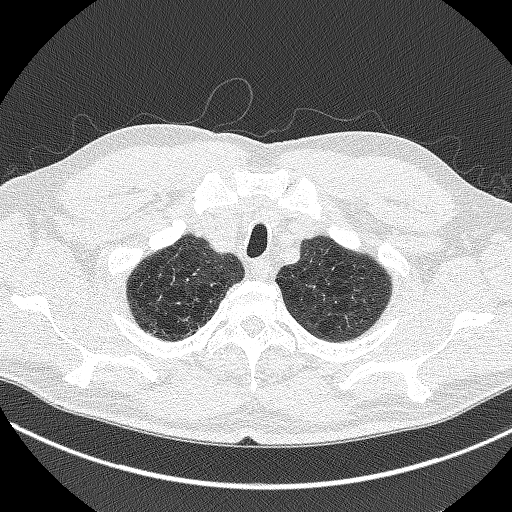
[im 324/340  lung]
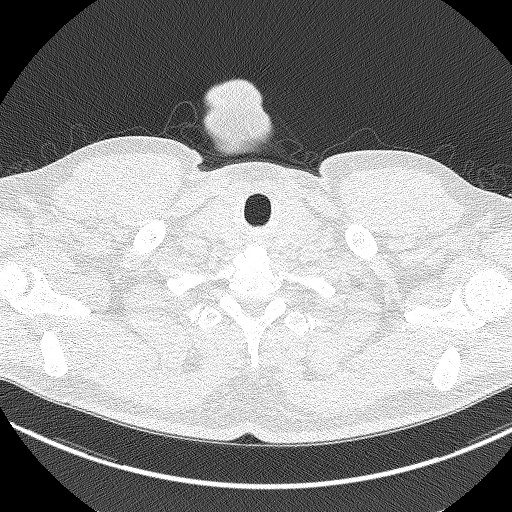

[Series 5: coronals lung 1.00 cor · coronal · 0.67mm/px · 3 of 278 slices shown]
[im 56/278  lung]
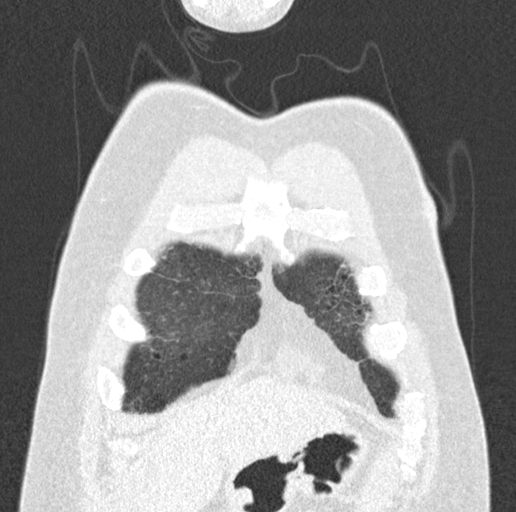
[im 111/278  lung]
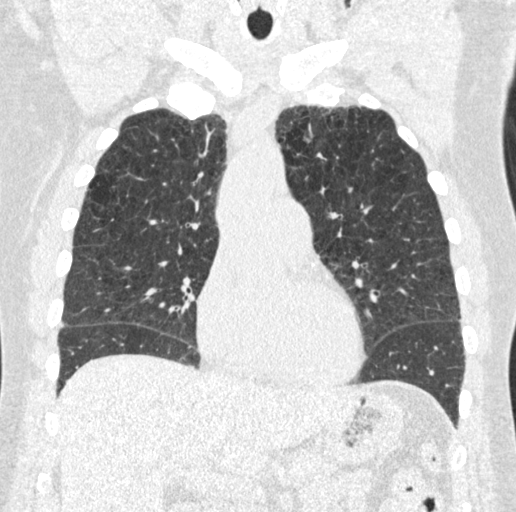
[im 167/278  lung]
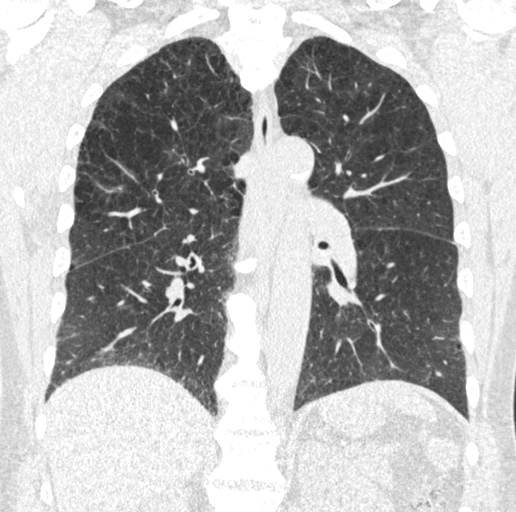

[15 of 40 positions shown; findings below may reference images not displayed]

FINDINGS: Cardiovascular: The heart size is normal. No substantial pericardial
effusion. Coronary artery calcification is evident. Mild
atherosclerotic calcification is noted in the wall of the thoracic
aorta.

Mediastinum/Nodes: No mediastinal lymphadenopathy. Upper normal
lymph nodes identified in the AP window. No evidence for gross hilar
lymphadenopathy although assessment is limited by the lack of
intravenous contrast on the current study. The esophagus has normal
imaging features. There is no axillary lymphadenopathy.

Lungs/Pleura: Centrilobular and paraseptal emphysema evident.
Several tiny pulmonary nodules identified measuring up to maximum
volume derived equivalent diameter of 2.0 mm. No suspicious nodule
or mass. No focal airspace consolidation. No pleural effusion.

Upper Abdomen: Unremarkable

Musculoskeletal: No worrisome lytic or sclerotic osseous
abnormality.
IMPRESSION: 1. Lung-RADS 2, benign appearance or behavior. Continue annual
screening with low-dose chest CT without contrast in 12 months.
2.  Emphysema (REMM7-SHP.6) and Aortic Atherosclerosis (REMM7-170.0)

## 2022-06-21 DIAGNOSIS — T827XXA Infection and inflammatory reaction due to other cardiac and vascular devices, implants and grafts, initial encounter: Secondary | ICD-10-CM | POA: Diagnosis not present

## 2022-06-22 DIAGNOSIS — I252 Old myocardial infarction: Secondary | ICD-10-CM | POA: Diagnosis not present

## 2022-06-22 DIAGNOSIS — I10 Essential (primary) hypertension: Secondary | ICD-10-CM | POA: Diagnosis not present

## 2022-06-22 DIAGNOSIS — I739 Peripheral vascular disease, unspecified: Secondary | ICD-10-CM | POA: Diagnosis not present

## 2022-06-22 DIAGNOSIS — I2510 Atherosclerotic heart disease of native coronary artery without angina pectoris: Secondary | ICD-10-CM | POA: Diagnosis not present

## 2022-06-22 DIAGNOSIS — E785 Hyperlipidemia, unspecified: Secondary | ICD-10-CM | POA: Diagnosis not present

## 2022-06-22 DIAGNOSIS — F17210 Nicotine dependence, cigarettes, uncomplicated: Secondary | ICD-10-CM | POA: Diagnosis not present

## 2022-06-28 DIAGNOSIS — T827XXA Infection and inflammatory reaction due to other cardiac and vascular devices, implants and grafts, initial encounter: Secondary | ICD-10-CM | POA: Diagnosis not present

## 2022-06-29 DIAGNOSIS — T827XXD Infection and inflammatory reaction due to other cardiac and vascular devices, implants and grafts, subsequent encounter: Secondary | ICD-10-CM | POA: Diagnosis not present

## 2022-07-26 DIAGNOSIS — R7303 Prediabetes: Secondary | ICD-10-CM | POA: Diagnosis not present

## 2022-07-26 DIAGNOSIS — N401 Enlarged prostate with lower urinary tract symptoms: Secondary | ICD-10-CM | POA: Diagnosis not present

## 2022-07-26 DIAGNOSIS — I10 Essential (primary) hypertension: Secondary | ICD-10-CM | POA: Diagnosis not present

## 2022-07-26 DIAGNOSIS — E785 Hyperlipidemia, unspecified: Secondary | ICD-10-CM | POA: Diagnosis not present

## 2022-07-26 DIAGNOSIS — E669 Obesity, unspecified: Secondary | ICD-10-CM | POA: Diagnosis not present

## 2022-07-28 DIAGNOSIS — Z739 Problem related to life management difficulty, unspecified: Secondary | ICD-10-CM | POA: Diagnosis not present

## 2022-07-28 DIAGNOSIS — Z0001 Encounter for general adult medical examination with abnormal findings: Secondary | ICD-10-CM | POA: Diagnosis not present

## 2022-07-28 DIAGNOSIS — Z1389 Encounter for screening for other disorder: Secondary | ICD-10-CM | POA: Diagnosis not present

## 2022-07-28 DIAGNOSIS — Z1331 Encounter for screening for depression: Secondary | ICD-10-CM | POA: Diagnosis not present

## 2022-10-21 ENCOUNTER — Ambulatory Visit: Payer: Medicare HMO | Attending: Internal Medicine

## 2022-10-26 DIAGNOSIS — N401 Enlarged prostate with lower urinary tract symptoms: Secondary | ICD-10-CM | POA: Diagnosis not present

## 2022-10-26 DIAGNOSIS — R7303 Prediabetes: Secondary | ICD-10-CM | POA: Diagnosis not present

## 2022-10-26 DIAGNOSIS — I10 Essential (primary) hypertension: Secondary | ICD-10-CM | POA: Diagnosis not present

## 2022-10-26 DIAGNOSIS — E782 Mixed hyperlipidemia: Secondary | ICD-10-CM | POA: Diagnosis not present

## 2022-10-26 DIAGNOSIS — E785 Hyperlipidemia, unspecified: Secondary | ICD-10-CM | POA: Diagnosis not present

## 2022-10-26 DIAGNOSIS — Z0001 Encounter for general adult medical examination with abnormal findings: Secondary | ICD-10-CM | POA: Diagnosis not present

## 2022-10-29 DIAGNOSIS — E785 Hyperlipidemia, unspecified: Secondary | ICD-10-CM | POA: Diagnosis not present

## 2022-10-29 DIAGNOSIS — E119 Type 2 diabetes mellitus without complications: Secondary | ICD-10-CM | POA: Diagnosis not present

## 2022-10-29 DIAGNOSIS — S335XXA Sprain of ligaments of lumbar spine, initial encounter: Secondary | ICD-10-CM | POA: Diagnosis not present

## 2022-10-29 DIAGNOSIS — D751 Secondary polycythemia: Secondary | ICD-10-CM | POA: Diagnosis not present

## 2022-10-29 DIAGNOSIS — F17210 Nicotine dependence, cigarettes, uncomplicated: Secondary | ICD-10-CM | POA: Diagnosis not present

## 2022-10-29 DIAGNOSIS — F418 Other specified anxiety disorders: Secondary | ICD-10-CM | POA: Diagnosis not present

## 2022-10-29 DIAGNOSIS — I2510 Atherosclerotic heart disease of native coronary artery without angina pectoris: Secondary | ICD-10-CM | POA: Diagnosis not present

## 2022-10-29 DIAGNOSIS — M542 Cervicalgia: Secondary | ICD-10-CM | POA: Diagnosis not present

## 2022-10-29 DIAGNOSIS — I10 Essential (primary) hypertension: Secondary | ICD-10-CM | POA: Diagnosis not present

## 2022-11-15 ENCOUNTER — Other Ambulatory Visit: Payer: Self-pay | Admitting: Internal Medicine

## 2022-11-26 ENCOUNTER — Other Ambulatory Visit: Payer: Self-pay | Admitting: Internal Medicine

## 2022-11-30 ENCOUNTER — Other Ambulatory Visit: Payer: Self-pay | Admitting: Internal Medicine

## 2022-12-07 ENCOUNTER — Other Ambulatory Visit: Payer: Self-pay | Admitting: Internal Medicine

## 2022-12-07 ENCOUNTER — Other Ambulatory Visit: Payer: Self-pay

## 2022-12-10 ENCOUNTER — Other Ambulatory Visit: Payer: Self-pay | Admitting: Internal Medicine

## 2022-12-10 ENCOUNTER — Other Ambulatory Visit: Payer: Medicare PPO

## 2022-12-11 LAB — FRUCTOSAMINE: Fructosamine: 234 umol/L (ref 0–285)

## 2022-12-13 ENCOUNTER — Ambulatory Visit (INDEPENDENT_AMBULATORY_CARE_PROVIDER_SITE_OTHER): Payer: Medicare PPO | Admitting: Internal Medicine

## 2022-12-13 ENCOUNTER — Encounter: Payer: Self-pay | Admitting: Internal Medicine

## 2022-12-13 VITALS — BP 110/60 | HR 73 | Ht 71.0 in | Wt 196.6 lb

## 2022-12-13 DIAGNOSIS — E782 Mixed hyperlipidemia: Secondary | ICD-10-CM

## 2022-12-13 DIAGNOSIS — I10 Essential (primary) hypertension: Secondary | ICD-10-CM | POA: Diagnosis not present

## 2022-12-13 DIAGNOSIS — E119 Type 2 diabetes mellitus without complications: Secondary | ICD-10-CM | POA: Diagnosis not present

## 2022-12-13 LAB — GLUCOSE, POCT (MANUAL RESULT ENTRY): POC Glucose: 105 mg/dl — AB (ref 70–99)

## 2022-12-13 MED ORDER — METFORMIN HCL ER 500 MG PO TB24: 500.0000 mg | ORAL_TABLET | Freq: Every day | ORAL | 0 refills | Status: DC

## 2022-12-13 NOTE — Progress Notes (Signed)
Established Patient Office Visit  Subjective:  Patient ID: Craig ELGART Sr., male    DOB: March 15, 1956  Age: 67 y.o. MRN: [id number]  Chief Complaint  Patient presents with   Follow-up    6 week follow up    No new complaints, here for lab review and medication refills. Labs reviewed and notable for well controlled diabetes per fructosamine of 234.     Past Medical History:  Diagnosis Date   HLD (hyperlipidemia)    Hypertension    Myocardial infarction [facility])     Past Surgical History:  Procedure Laterality Date   CORONARY ANGIOPLASTY WITH STENT PLACEMENT     FEMORAL ARTERY STENT      Social History   Socioeconomic History   Marital status: Single    Spouse name: Not on file   Number of children: Not on file   Years of education: Not on file   Highest education level: Not on file  Occupational History   Not on file  Tobacco Use   Smoking status: Every Day    Types: Cigarettes   Smokeless tobacco: Never  Vaping Use   Vaping Use: Never used  Substance and Sexual Activity   Alcohol use: Not Currently   Drug use: Not Currently   Sexual activity: Not on file  Other Topics Concern   Not on file  Social History Narrative   Not on file   Social Determinants of Health   Financial Resource Strain: Not on file  Food Insecurity: Not on file  Transportation Needs: No Transportation Needs (05/26/2022)   PRAPARE - Transportation    Lack of Transportation (Medical): No    Lack of Transportation (Non-Medical): No  Physical Activity: Not on file  Stress: Not on file  Social Connections: Not on file  Intimate Partner Violence: Not on file    No family history on file.  No Known Allergies  Review of Systems  Constitutional: Negative.   HENT: Negative.    Eyes: Negative.   Respiratory: Negative.    Cardiovascular: Negative.   Gastrointestinal: Negative.   Genitourinary: Negative.   Skin: Negative.   Neurological: Negative.   Endo/Heme/Allergies: Negative.         Objective:   BP 110/60   Pulse 73   Ht '5\' 11"'$  (1.803 m)   Wt 196 lb 9.6 oz (89.2 kg)   SpO2 95%   BMI 27.42 kg/m   Vitals:   12/13/22 1058  BP: 110/60  Pulse: 73  Height: '5\' 11"'$  (1.803 m)  Weight: 196 lb 9.6 oz (89.2 kg)  SpO2: 95%  BMI (Calculated): 27.43    Physical Exam Vitals reviewed.  Constitutional:      Appearance: Normal appearance. He is obese.  HENT:     Head: Normocephalic.     Left Ear: There is no impacted cerumen.     Nose: Nose normal.     Mouth/Throat:     Mouth: Mucous membranes are moist.     Pharynx: No posterior oropharyngeal erythema.  Eyes:     Extraocular Movements: Extraocular movements intact.     Pupils: Pupils are equal, round, and reactive to light.  Cardiovascular:     Rate and Rhythm: Regular rhythm.     Chest Wall: PMI is not displaced.     Pulses: Normal pulses.     Heart sounds: Normal heart sounds. No murmur heard. Pulmonary:     Effort: Pulmonary effort is normal.     Breath sounds: Normal air entry.  No rhonchi or rales.  Abdominal:     General: Abdomen is flat. Bowel sounds are normal. There is no distension.     Palpations: Abdomen is soft. There is no hepatomegaly, splenomegaly or mass.     Tenderness: There is no abdominal tenderness.  Musculoskeletal:        General: Normal range of motion.     Cervical back: Normal range of motion and neck supple.     Right lower leg: No edema.     Left lower leg: No edema.  Skin:    General: Skin is warm and dry.  Neurological:     General: No focal deficit present.     Mental Status: He is alert and oriented to person, place, and time.     Cranial Nerves: No cranial nerve deficit.     Motor: No weakness.  Psychiatric:        Mood and Affect: Mood normal.        Behavior: Behavior normal.      Results for orders placed or performed in visit on 12/13/22  POCT Glucose (CBG)  Result Value Ref Range   POC Glucose 105 (A) 70 - 99 mg/dl    Recent Results (from the  past 2160 hour(Orine Goga))  Fructosamine     Status: None   Collection Time: 12/10/22 12:31 PM  Result Value Ref Range   Fructosamine 234 0 - 285 umol/L    Comment: Published reference interval for apparently healthy subjects between age 66 and 30 is 16 - 285 umol/L and in a poorly controlled diabetic population is 228 - 563 umol/L with a mean of 396 umol/L.   POCT Glucose (CBG)     Status: Abnormal   Collection Time: 12/13/22 11:09 AM  Result Value Ref Range   POC Glucose 105 (A) 70 - 99 mg/dl      Assessment & Plan:   Problem List Items Addressed This Visit       Cardiovascular and Mediastinum   Essential hypertension   Other Visit Diagnoses     Type 2 diabetes mellitus without complication, without long-term current use of insulin (HCC)    -  Primary   Relevant Medications   metFORMIN (GLUCOPHAGE-XR) 500 MG 24 hr tablet   Other Relevant Orders   POCT Glucose (CBG) (Completed)   Hemoglobin A1c   Comprehensive metabolic panel   Lipid panel   Hemoglobin A1c   Mixed hyperlipidemia       Relevant Orders   Lipid panel   CK   Hepatic function panel      1. Type 2 diabetes mellitus without complication, without long-term current use of insulin (HCC) - POCT Glucose (CBG) - metFORMIN (GLUCOPHAGE-XR) 500 MG 24 hr tablet; Take 1 tablet (500 mg total) by mouth daily with breakfast.  Dispense: 90 tablet; Refill: 0 - Hemoglobin A1c; Future - Comprehensive metabolic panel; Future - Lipid panel; Future - Hemoglobin A1c; Future  2. Essential hypertension  3. Mixed hyperlipidemia - Lipid panel; Future - CK; Future - Hepatic function panel   Return in about 2 months (around 02/12/2023) for fu with labs prior.   Total time spent: 20 minutes  Volanda Napoleon, MD  12/13/2022

## 2022-12-15 ENCOUNTER — Other Ambulatory Visit: Payer: Self-pay | Admitting: Internal Medicine

## 2023-01-01 ENCOUNTER — Other Ambulatory Visit: Payer: Self-pay | Admitting: Internal Medicine

## 2023-01-09 ENCOUNTER — Other Ambulatory Visit: Payer: Self-pay | Admitting: Internal Medicine

## 2023-01-14 ENCOUNTER — Other Ambulatory Visit: Payer: Self-pay | Admitting: Internal Medicine

## 2023-01-25 ENCOUNTER — Other Ambulatory Visit: Payer: Self-pay | Admitting: Internal Medicine

## 2023-01-25 DIAGNOSIS — E119 Type 2 diabetes mellitus without complications: Secondary | ICD-10-CM

## 2023-02-14 ENCOUNTER — Ambulatory Visit: Payer: Medicare PPO | Admitting: Internal Medicine

## 2023-02-17 ENCOUNTER — Other Ambulatory Visit: Payer: Self-pay | Admitting: Internal Medicine

## 2023-02-21 ENCOUNTER — Other Ambulatory Visit: Payer: Medicare PPO

## 2023-02-21 DIAGNOSIS — E119 Type 2 diabetes mellitus without complications: Secondary | ICD-10-CM

## 2023-02-21 DIAGNOSIS — E782 Mixed hyperlipidemia: Secondary | ICD-10-CM

## 2023-02-22 LAB — LIPID PANEL
Chol/HDL Ratio: 4 ratio (ref 0.0–5.0)
Cholesterol, Total: 93 mg/dL — ABNORMAL LOW (ref 100–199)
HDL: 23 mg/dL — ABNORMAL LOW (ref >39)
LDL Chol Calc (NIH): 45 mg/dL (ref 0–99)
Triglycerides: 140 mg/dL (ref 0–149)
VLDL Cholesterol Cal: 25 mg/dL (ref 5–40)

## 2023-02-22 LAB — HEMOGLOBIN A1C
Est. average glucose Bld gHb Est-mCnc: 134 mg/dL
Hgb A1c MFr Bld: 6.3 % — ABNORMAL HIGH (ref 4.8–5.6)

## 2023-02-22 LAB — CK: Total CK: 143 U/L (ref 41–331)

## 2023-02-23 ENCOUNTER — Encounter: Payer: Self-pay | Admitting: Dietician

## 2023-02-23 ENCOUNTER — Encounter: Payer: Medicare PPO | Attending: Internal Medicine | Admitting: Dietician

## 2023-02-23 ENCOUNTER — Encounter: Payer: Self-pay | Admitting: Internal Medicine

## 2023-02-23 ENCOUNTER — Ambulatory Visit (INDEPENDENT_AMBULATORY_CARE_PROVIDER_SITE_OTHER): Payer: Medicare PPO | Admitting: Internal Medicine

## 2023-02-23 VITALS — Ht 71.0 in | Wt 196.6 lb

## 2023-02-23 VITALS — BP 132/82 | HR 74 | Ht 71.0 in | Wt 196.2 lb

## 2023-02-23 DIAGNOSIS — E119 Type 2 diabetes mellitus without complications: Secondary | ICD-10-CM

## 2023-02-23 DIAGNOSIS — I10 Essential (primary) hypertension: Secondary | ICD-10-CM | POA: Insufficient documentation

## 2023-02-23 DIAGNOSIS — I252 Old myocardial infarction: Secondary | ICD-10-CM | POA: Diagnosis not present

## 2023-02-23 DIAGNOSIS — L309 Dermatitis, unspecified: Secondary | ICD-10-CM

## 2023-02-23 DIAGNOSIS — Z713 Dietary counseling and surveillance: Secondary | ICD-10-CM | POA: Diagnosis not present

## 2023-02-23 DIAGNOSIS — F32A Depression, unspecified: Secondary | ICD-10-CM | POA: Diagnosis not present

## 2023-02-23 DIAGNOSIS — Z6827 Body mass index (BMI) 27.0-27.9, adult: Secondary | ICD-10-CM | POA: Insufficient documentation

## 2023-02-23 DIAGNOSIS — I2510 Atherosclerotic heart disease of native coronary artery without angina pectoris: Secondary | ICD-10-CM

## 2023-02-23 DIAGNOSIS — E669 Obesity, unspecified: Secondary | ICD-10-CM | POA: Insufficient documentation

## 2023-02-23 LAB — POCT CBG (FASTING - GLUCOSE)-MANUAL ENTRY: Glucose Fasting, POC: 101 mg/dL — AB (ref 70–99)

## 2023-02-23 MED ORDER — TRIAMCINOLONE ACETONIDE 0.5 % EX OINT: 1.0000 | TOPICAL_OINTMENT | Freq: Two times a day (BID) | CUTANEOUS | 0 refills | Status: DC

## 2023-02-23 NOTE — Progress Notes (Signed)
Established Patient Office Visit  Subjective:  Patient ID: Craig SWINEY Sr., male    DOB: 1956/04/16  Age: 67 y.o. MRN: [id number]  Chief Complaint  Patient presents with   Follow-up    2 month follow up, discuss lab results.    C/o itchy rash on his back and flanks x 4 wks. Denies change in personal hygiene products, new Rx or otc supplements. No new complaints, here for lab review and medication refills. Labs reviewed and notable for well controlled diabetes, A1c at target. Denies any hypoglycemic episodes and home bg readings have been at target. Ldl at target.      No other concerns at this time.   Past Medical History:  Diagnosis Date   HLD (hyperlipidemia)    Hypertension    Myocardial infarction [facility])     Past Surgical History:  Procedure Laterality Date   CORONARY ANGIOPLASTY WITH STENT PLACEMENT     FEMORAL ARTERY STENT      Social History   Socioeconomic History   Marital status: Single    Spouse name: Not on file   Number of children: Not on file   Years of education: Not on file   Highest education level: Not on file  Occupational History   Not on file  Tobacco Use   Smoking status: Every Day    Types: Cigarettes   Smokeless tobacco: Never  Vaping Use   Vaping Use: Never used  Substance and Sexual Activity   Alcohol use: Not Currently   Drug use: Not Currently   Sexual activity: Not on file  Other Topics Concern   Not on file  Social History Narrative   Not on file   Social Determinants of Health   Financial Resource Strain: Not on file  Food Insecurity: Not on file  Transportation Needs: No Transportation Needs (05/26/2022)   PRAPARE - Transportation    Lack of Transportation (Medical): No    Lack of Transportation (Non-Medical): No  Physical Activity: Not on file  Stress: Not on file  Social Connections: Not on file  Intimate Partner Violence: Not on file    No family history on file.  No Known Allergies  Review of Systems   All other systems reviewed and are negative.      Objective:   BP 132/82   Pulse 74   Ht 5\' 11"  (1.803 m)   Wt 196 lb 3.2 oz (89 kg)   SpO2 96%   BMI 27.36 kg/m   Vitals:   02/23/23 1012  BP: 132/82  Pulse: 74  Height: 5\' 11"  (1.803 m)  Weight: 196 lb 3.2 oz (89 kg)  SpO2: 96%  BMI (Calculated): 27.38    Physical Exam Vitals reviewed.  Constitutional:      Appearance: Normal appearance.  HENT:     Head: Normocephalic.     Left Ear: There is no impacted cerumen.     Nose: Nose normal.     Mouth/Throat:     Mouth: Mucous membranes are moist.     Pharynx: No posterior oropharyngeal erythema.  Eyes:     Extraocular Movements: Extraocular movements intact.     Pupils: Pupils are equal, round, and reactive to light.  Cardiovascular:     Rate and Rhythm: Regular rhythm.     Chest Wall: PMI is not displaced.     Pulses: Normal pulses.     Heart sounds: Normal heart sounds. No murmur heard. Pulmonary:     Effort: Pulmonary effort is  normal.     Breath sounds: Normal air entry. No rhonchi or rales.  Abdominal:     General: Abdomen is flat. Bowel sounds are normal. There is no distension.     Palpations: Abdomen is soft. There is no hepatomegaly, splenomegaly or mass.     Tenderness: There is no abdominal tenderness.  Musculoskeletal:        General: Normal range of motion.     Cervical back: Normal range of motion and neck supple.     Right lower leg: No edema.     Left lower leg: No edema.  Skin:    General: Skin is warm and dry.     Findings: Rash present.     Comments: Macular rash in both axillae and flanks  Neurological:     General: No focal deficit present.     Mental Status: He is alert and oriented to person, place, and time.     Cranial Nerves: No cranial nerve deficit.     Motor: No weakness.  Psychiatric:        Mood and Affect: Mood normal.        Behavior: Behavior normal.      Results for orders placed or performed in visit on 02/23/23   POCT CBG (Fasting - Glucose)  Result Value Ref Range   Glucose Fasting, POC 101 (A) 70 - 99 mg/dL    Recent Results (from the past 2160 hour(Maryjo Ragon))  Fructosamine     Status: None   Collection Time: 12/10/22 12:31 PM  Result Value Ref Range   Fructosamine 234 0 - 285 umol/L    Comment: Published reference interval for apparently healthy subjects between age 21 and 50 is 24 - 285 umol/L and in a poorly controlled diabetic population is 228 - 563 umol/L with a mean of 396 umol/L.   POCT Glucose (CBG)     Status: Abnormal   Collection Time: 12/13/22 11:09 AM  Result Value Ref Range   POC Glucose 105 (A) 70 - 99 mg/dl  CK     Status: None   Collection Time: 02/21/23 10:11 AM  Result Value Ref Range   Total CK 143 41 - 331 U/L  Lipid panel     Status: Abnormal   Collection Time: 02/21/23 10:11 AM  Result Value Ref Range   Cholesterol, Total 93 (L) 100 - 199 mg/dL   Triglycerides 161 0 - 149 mg/dL   HDL 23 (L) >09 mg/dL   VLDL Cholesterol Cal 25 5 - 40 mg/dL   LDL Chol Calc (NIH) 45 0 - 99 mg/dL   Chol/HDL Ratio 4.0 0.0 - 5.0 ratio    Comment:                                   T. Chol/HDL Ratio                                             Men  Women                               1/2 Avg.Risk  3.4    3.3  Avg.Risk  5.0    4.4                                2X Avg.Risk  9.6    7.1                                3X Avg.Risk 23.4   11.0   Hemoglobin A1c     Status: Abnormal   Collection Time: 02/21/23 10:11 AM  Result Value Ref Range   Hgb A1c MFr Bld 6.3 (H) 4.8 - 5.6 %    Comment:          Prediabetes: 5.7 - 6.4          Diabetes: >6.4          Glycemic control for adults with diabetes: <7.0    Est. average glucose Bld gHb Est-mCnc 134 mg/dL  POCT CBG (Fasting - Glucose)     Status: Abnormal   Collection Time: 02/23/23 10:19 AM  Result Value Ref Range   Glucose Fasting, POC 101 (A) 70 - 99 mg/dL      Assessment & Plan:   Problem List  Items Addressed This Visit   None Visit Diagnoses     Type 2 diabetes mellitus without complication, without long-term current use of insulin (HCC)    -  Primary   Relevant Orders   POCT CBG (Fasting - Glucose) (Completed)       No follow-ups on file.   Total time spent: 30 minutes  Luna Fuse, MD  02/23/2023   This document may have been prepared by [facility] Voice Recognition software and as such may include unintentional dictation errors.

## 2023-02-23 NOTE — Progress Notes (Signed)
Medical Nutrition Therapy: Visit start time: 1320  end time: 1420  Assessment:   Referral Diagnosis: coronary artery disese, Type 2 diabetes, obesity Other medical history/ diagnoses: HTN, history of MI several years ago and stent placement, bypass surgery 2023, history of depression Psychosocial issues/ stress concerns: history of depression  Medications, supplements: reconciled list in medical record   Preferred learning method:  Auditory Hands-on  Current weight: 196.6lbs Height: 5'11" BMI: 27.42 Patient's personal weight goal: maintain current weight  Progress and evaluation:  Patient reports loss of about 50lbs over the past several months, mostly by reducing portions of starches ie pasta and rice; choosing mostly poultry and fish for animal proteins, and has increased vegetables and fruits.  Recent labs indicate: HbA1C 6.3%, total cholesterol 93, HDL 23, LDL 45, triglycerides 140 (02/21/23) Food allergies: lactose intolerance; uses lactose free milk Special diet practices: none Patient seeks help with reducing health risk Next PCP appt is 03/2023   Dietary Intake:  Usual eating pattern includes 3 meals and 0 snacks per day. Dining out frequency: 0 meals per week. Who plans meals/ buys groceries? self Who prepares meals? self  Breakfast: eggs and ham; honey nut cheerios and lactose free milk Snack: none Lunch: sandwich with turkey/ smoked [place] + cheese, strawberries recently Snack: none; used to eat sweets Supper: mixed froz veg corn/ carrots green beans with spicy chicken with noodles; avoids pork and beef; chicken/ shrimp with pasta (oodles of noodles), tries to limit amount of seasoning, sometimes adds olive oil Snack: none Beverages: water  Physical activity: ADLs, 3 teenage boys in home   Intervention:   Nutrition Care Education:   Basic nutrition: basic food groups; appropriate nutrient balance; appropriate meal and snack schedule; general nutrition guidelines     Diabetes:  appropriate carb intake and balance, healthy carb choices; role of protein,  Hypertension:  identifying high sodium foods, choosing low sodium options and limiting packaged meals; identifying food sources of potassium, magnesium Hyperlipidemia: healthy and unhealthy fats; role of fiber; role of exercise   Other intervention notes: Patient has been making positive diet and lifestyle changes, and he is motivated to continue.  Established additional goals to ensure balanced and adequate nutrition.  Patient declined scheduling follow up visit; he will schedule later if needed.    Nutritional Diagnosis:  [place]-2.1 Inpaired nutrition utilization and [place]-2.2 Altered nutrition-related laboratory As related to CAD/ hyperlipidemia/hypertension; diabetes.  As evidenced by low HDL and history of heart attack and surgeries, cholesterol and BP controlled with medications; elevated HbA1C.   Education Materials given:  Designer, industrial/product with food lists, sample meal pattern Sample menus Visit summary with goals/ instructions   Learner/ who was taught:  Patient   Level of understanding: Verbalizes/ demonstrates competency  Demonstrated degree of understanding via:   Teach back Learning barriers: None  Willingness to learn/ readiness for change: Eager, change in progress  Monitoring and Evaluation:  Dietary intake, exercise, blood lipids, BP, and BG control, and body weight      follow up: prn

## 2023-02-23 NOTE — Patient Instructions (Addendum)
When eating cereal add a protein food like 1-2 boiled eggs or 1/4 cup of nuts.  Include a vegetable and/or a fruit with every meal.  Have large portions of veggies and small portions of starchy foods Take some short walks or do some other exercise most days of the week.

## 2023-03-18 ENCOUNTER — Ambulatory Visit: Payer: Medicare PPO | Admitting: Internal Medicine

## 2023-03-18 ENCOUNTER — Other Ambulatory Visit: Payer: Self-pay | Admitting: Internal Medicine

## 2023-03-18 ENCOUNTER — Other Ambulatory Visit: Payer: Self-pay | Admitting: Nurse Practitioner

## 2023-03-21 ENCOUNTER — Other Ambulatory Visit: Payer: Self-pay | Admitting: Internal Medicine

## 2023-03-23 ENCOUNTER — Ambulatory Visit (INDEPENDENT_AMBULATORY_CARE_PROVIDER_SITE_OTHER): Payer: Medicare PPO | Admitting: Internal Medicine

## 2023-03-23 VITALS — BP 140/80 | HR 80 | Ht 71.0 in | Wt 196.4 lb

## 2023-03-23 DIAGNOSIS — I10 Essential (primary) hypertension: Secondary | ICD-10-CM

## 2023-03-23 DIAGNOSIS — L309 Dermatitis, unspecified: Secondary | ICD-10-CM | POA: Diagnosis not present

## 2023-03-23 DIAGNOSIS — E782 Mixed hyperlipidemia: Secondary | ICD-10-CM

## 2023-03-23 DIAGNOSIS — E119 Type 2 diabetes mellitus without complications: Secondary | ICD-10-CM

## 2023-03-23 LAB — POCT CBG (FASTING - GLUCOSE)-MANUAL ENTRY: Glucose Fasting, POC: 107 mg/dL — AB (ref 70–99)

## 2023-03-23 MED ORDER — ATORVASTATIN CALCIUM 20 MG PO TABS: 20.0000 mg | ORAL_TABLET | Freq: Every evening | ORAL | 0 refills | Status: DC

## 2023-03-23 NOTE — Progress Notes (Signed)
Established Patient Office Visit  Subjective:  Patient ID: Craig MORES Sr., male    DOB: 02-Mar-1956  Age: 67 y.o. MRN: [id number]  Chief Complaint  Patient presents with   Follow-up    3 week f/u    No new complaints and rash has resolved.     No other concerns at this time.   Past Medical History:  Diagnosis Date   HLD (hyperlipidemia)    Hypertension    Myocardial infarction [facility])     Past Surgical History:  Procedure Laterality Date   CORONARY ANGIOPLASTY WITH STENT PLACEMENT     FEMORAL ARTERY STENT      Social History   Socioeconomic History   Marital status: Single    Spouse name: Not on file   Number of children: Not on file   Years of education: Not on file   Highest education level: Not on file  Occupational History   Not on file  Tobacco Use   Smoking status: Former    Packs/day: 2.00    Years: 50.00    Additional pack years: 0.00    Total pack years: 100.00    Types: Cigarettes    Start date: 10/1972    Quit date: 10/2022    Years since quitting: 0.4   Smokeless tobacco: Never  Vaping Use   Vaping Use: Never used  Substance and Sexual Activity   Alcohol use: Not Currently   Drug use: Not Currently   Sexual activity: Not on file  Other Topics Concern   Not on file  Social History Narrative   Not on file   Social Determinants of Health   Financial Resource Strain: Not on file  Food Insecurity: Not on file  Transportation Needs: No Transportation Needs (05/26/2022)   PRAPARE - Transportation    Lack of Transportation (Medical): No    Lack of Transportation (Non-Medical): No  Physical Activity: Not on file  Stress: Not on file  Social Connections: Not on file  Intimate Partner Violence: Not on file    No family history on file.  No Known Allergies  Review of Systems  All other systems reviewed and are negative.      Objective:   BP (!) 140/80   Pulse 80   Ht 5\' 11"  (1.803 m)   Wt 196 lb 6.4 oz (89.1 kg)   SpO2 98%    BMI 27.39 kg/m   Vitals:   03/23/23 1144  BP: (!) 140/80  Pulse: 80  Height: 5\' 11"  (1.803 m)  Weight: 196 lb 6.4 oz (89.1 kg)  SpO2: 98%  BMI (Calculated): 27.4    Physical Exam Vitals reviewed.  Constitutional:      Appearance: Normal appearance.  HENT:     Head: Normocephalic.     Left Ear: There is no impacted cerumen.     Nose: Nose normal.     Mouth/Throat:     Mouth: Mucous membranes are moist.     Pharynx: No posterior oropharyngeal erythema.  Eyes:     Extraocular Movements: Extraocular movements intact.     Pupils: Pupils are equal, round, and reactive to light.  Cardiovascular:     Rate and Rhythm: Regular rhythm.     Chest Wall: PMI is not displaced.     Pulses: Normal pulses.     Heart sounds: Normal heart sounds. No murmur heard. Pulmonary:     Effort: Pulmonary effort is normal.     Breath sounds: Normal air entry. No rhonchi  or rales.  Abdominal:     General: Abdomen is flat. Bowel sounds are normal. There is no distension.     Palpations: Abdomen is soft. There is no hepatomegaly, splenomegaly or mass.     Tenderness: There is no abdominal tenderness.  Musculoskeletal:        General: Normal range of motion.     Cervical back: Normal range of motion and neck supple.     Right lower leg: No edema.     Left lower leg: No edema.  Skin:    General: Skin is warm and dry.  Neurological:     General: No focal deficit present.     Mental Status: He is alert and oriented to person, place, and time.     Cranial Nerves: No cranial nerve deficit.     Motor: No weakness.  Psychiatric:        Mood and Affect: Mood normal.        Behavior: Behavior normal.      Results for orders placed or performed in visit on 03/23/23  POCT CBG (Fasting - Glucose)  Result Value Ref Range   Glucose Fasting, POC 107 (A) 70 - 99 mg/dL    Recent Results (from the past 2160 hour(Jackelynn Hosie))  CK     Status: None   Collection Time: 02/21/23 10:11 AM  Result Value Ref Range    Total CK 143 41 - 331 U/L  Lipid panel     Status: Abnormal   Collection Time: 02/21/23 10:11 AM  Result Value Ref Range   Cholesterol, Total 93 (L) 100 - 199 mg/dL   Triglycerides 829 0 - 149 mg/dL   HDL 23 (L) >56 mg/dL   VLDL Cholesterol Cal 25 5 - 40 mg/dL   LDL Chol Calc (NIH) 45 0 - 99 mg/dL   Chol/HDL Ratio 4.0 0.0 - 5.0 ratio    Comment:                                   T. Chol/HDL Ratio                                             Men  Women                               1/2 Avg.Risk  3.4    3.3                                   Avg.Risk  5.0    4.4                                2X Avg.Risk  9.6    7.1                                3X Avg.Risk 23.4   11.0   Hemoglobin A1c     Status: Abnormal   Collection Time: 02/21/23 10:11 AM  Result Value Ref Range   Hgb A1c MFr Bld 6.3 (H) 4.8 - 5.6 %  Comment:          Prediabetes: 5.7 - 6.4          Diabetes: >6.4          Glycemic control for adults with diabetes: <7.0    Est. average glucose Bld gHb Est-mCnc 134 mg/dL  POCT CBG (Fasting - Glucose)     Status: Abnormal   Collection Time: 02/23/23 10:19 AM  Result Value Ref Range   Glucose Fasting, POC 101 (A) 70 - 99 mg/dL  POCT CBG (Fasting - Glucose)     Status: Abnormal   Collection Time: 03/23/23 11:50 AM  Result Value Ref Range   Glucose Fasting, POC 107 (A) 70 - 99 mg/dL      Assessment & Plan:  As per problem list  Problem List Items Addressed This Visit       Cardiovascular and Mediastinum   Essential hypertension   Relevant Medications   atorvastatin (LIPITOR) 20 MG tablet   Other Visit Diagnoses     Type 2 diabetes mellitus without complication, without long-term current use of insulin (HCC)    -  Primary   Relevant Medications   atorvastatin (LIPITOR) 20 MG tablet   Other Relevant Orders   POCT CBG (Fasting - Glucose) (Completed)   Dermatitis       Mixed hyperlipidemia       Relevant Medications   atorvastatin (LIPITOR) 20 MG tablet        Return in about 10 weeks (around 06/01/2023) for fu with labs prior.   Total time spent: 20 minutes  Luna Fuse, MD  03/23/2023   This document may have been prepared by [facility] Voice Recognition software and as such may include unintentional dictation errors.

## 2023-04-17 ENCOUNTER — Other Ambulatory Visit: Payer: Self-pay | Admitting: Family

## 2023-04-17 ENCOUNTER — Other Ambulatory Visit: Payer: Self-pay | Admitting: Nurse Practitioner

## 2023-04-30 ENCOUNTER — Other Ambulatory Visit: Payer: Self-pay | Admitting: Nurse Practitioner

## 2023-04-30 DIAGNOSIS — E119 Type 2 diabetes mellitus without complications: Secondary | ICD-10-CM

## 2023-05-17 ENCOUNTER — Other Ambulatory Visit: Payer: Self-pay | Admitting: Internal Medicine

## 2023-05-31 ENCOUNTER — Other Ambulatory Visit: Payer: Medicare PPO

## 2023-05-31 DIAGNOSIS — E119 Type 2 diabetes mellitus without complications: Secondary | ICD-10-CM | POA: Diagnosis not present

## 2023-06-01 ENCOUNTER — Encounter: Payer: Self-pay | Admitting: Internal Medicine

## 2023-06-01 ENCOUNTER — Ambulatory Visit: Payer: Medicare PPO | Admitting: Internal Medicine

## 2023-06-01 VITALS — BP 110/70 | HR 94 | Ht 71.0 in | Wt 197.0 lb

## 2023-06-01 DIAGNOSIS — E119 Type 2 diabetes mellitus without complications: Secondary | ICD-10-CM | POA: Diagnosis not present

## 2023-06-01 DIAGNOSIS — N1831 Chronic kidney disease, stage 3a: Secondary | ICD-10-CM | POA: Diagnosis not present

## 2023-06-01 DIAGNOSIS — I10 Essential (primary) hypertension: Secondary | ICD-10-CM | POA: Diagnosis not present

## 2023-06-01 LAB — COMPREHENSIVE METABOLIC PANEL
ALT: 14 IU/L (ref 0–44)
AST: 15 IU/L (ref 0–40)
Albumin: 4.6 g/dL (ref 3.9–4.9)
Alkaline Phosphatase: 112 IU/L (ref 44–121)
BUN/Creatinine Ratio: 10 (ref 10–24)
BUN: 14 mg/dL (ref 8–27)
Bilirubin Total: 0.8 mg/dL (ref 0.0–1.2)
CO2: 27 mmol/L (ref 20–29)
Calcium: 9.6 mg/dL (ref 8.6–10.2)
Chloride: 100 mmol/L (ref 96–106)
Creatinine, Ser: 1.44 mg/dL — ABNORMAL HIGH (ref 0.76–1.27)
Globulin, Total: 2.8 g/dL (ref 1.5–4.5)
Glucose: 129 mg/dL — ABNORMAL HIGH (ref 70–99)
Potassium: 4.6 mmol/L (ref 3.5–5.2)
Sodium: 140 mmol/L (ref 134–144)
Total Protein: 7.4 g/dL (ref 6.0–8.5)
eGFR: 54 mL/min/{1.73_m2} — ABNORMAL LOW (ref >59)

## 2023-06-01 LAB — HEMOGLOBIN A1C
Est. average glucose Bld gHb Est-mCnc: 134 mg/dL
Hgb A1c MFr Bld: 6.3 % — ABNORMAL HIGH (ref 4.8–5.6)

## 2023-06-01 LAB — POCT URINALYSIS DIPSTICK
Bilirubin, UA: NEGATIVE
Blood, UA: NEGATIVE
Glucose, UA: NEGATIVE
Ketones, UA: NEGATIVE
Leukocytes, UA: NEGATIVE
Nitrite, UA: NEGATIVE
Protein, UA: NEGATIVE
Spec Grav, UA: 1.015 (ref 1.010–1.025)
Urobilinogen, UA: 0.2 E.U./dL
pH, UA: 6 (ref 5.0–8.0)

## 2023-06-01 LAB — LIPID PANEL
Chol/HDL Ratio: 5 ratio (ref 0.0–5.0)
Cholesterol, Total: 119 mg/dL (ref 100–199)
HDL: 24 mg/dL — ABNORMAL LOW (ref >39)
LDL Chol Calc (NIH): 66 mg/dL (ref 0–99)
Triglycerides: 171 mg/dL — ABNORMAL HIGH (ref 0–149)
VLDL Cholesterol Cal: 29 mg/dL (ref 5–40)

## 2023-06-01 LAB — POC CREATINE & ALBUMIN,URINE
Albumin/Creatinine Ratio, Urine, POC: <30
Creatinine, POC: 50 mg/dL
Microalbumin Ur, POC: 10 mg/L

## 2023-06-01 LAB — POCT CBG (FASTING - GLUCOSE)-MANUAL ENTRY: Glucose Fasting, POC: 84 mg/dL (ref 70–99)

## 2023-06-01 NOTE — Progress Notes (Signed)
Established Patient Office Visit  Subjective:  Patient ID: Craig YATSKO Sr., male    DOB: 01/29/1956  Age: 67 y.o. MRN: [id number]  Chief Complaint  Patient presents with   Follow-up    10 week f/u    No new complaints, here for lab review and medication refills. Labs reviewed and notable for well controlled diabetes, A1c remains at target, lipids at target but cmp notable for decline in GFR. Stopped nsaids several months ago. Denies any hypoglycemic episodes and home bg readings have been at target.     No other concerns at this time.   Past Medical History:  Diagnosis Date   HLD (hyperlipidemia)    Hypertension    Myocardial infarction [facility])     Past Surgical History:  Procedure Laterality Date   CORONARY ANGIOPLASTY WITH STENT PLACEMENT     FEMORAL ARTERY STENT      Social History   Socioeconomic History   Marital status: Single    Spouse name: Not on file   Number of children: Not on file   Years of education: Not on file   Highest education level: Not on file  Occupational History   Not on file  Tobacco Use   Smoking status: Former    Current packs/day: 0.00    Average packs/day: 2.0 packs/day for 50.0 years (100.0 ttl pk-yrs)    Types: Cigarettes    Start date: 10/1972    Quit date: 10/2022    Years since quitting: 0.6   Smokeless tobacco: Never  Vaping Use   Vaping status: Never Used  Substance and Sexual Activity   Alcohol use: Not Currently   Drug use: Not Currently   Sexual activity: Not on file  Other Topics Concern   Not on file  Social History Narrative   Not on file   Social Determinants of Health   Financial Resource Strain: Not on file  Food Insecurity: Food Insecurity Present (07/23/2019)   Received from [facility], [facility]   Hunger Vital Sign    Worried About Running Out of Food in the Last Year: Sometimes true    Ran Out of Food in the Last Year: Sometimes true  Transportation Needs: No Transportation Needs  (05/26/2022)   PRAPARE - Administrator, Civil Service (Medical): No    Lack of Transportation (Non-Medical): No  Physical Activity: Insufficiently Active (07/23/2019)   Received from [facility], [facility]   Exercise Vital Sign    Days of Exercise per Week: 5 days    Minutes of Exercise per Session: 10 min  Stress: Not on file  Social Connections: Not on file  Intimate Partner Violence: Not on file    No family history on file.  No Known Allergies  Review of Systems  Constitutional: Negative.   HENT: Negative.    Eyes: Negative.   Respiratory: Negative.    Cardiovascular: Negative.   Gastrointestinal: Negative.   Genitourinary: Negative.   Skin: Negative.   Neurological: Negative.   Endo/Heme/Allergies: Negative.        Objective:   BP 110/70   Pulse 94   Ht 5\' 11"  (1.803 m)   Wt 197 lb (89.4 kg)   SpO2 98%   BMI 27.48 kg/m   Vitals:   06/01/23 0956  BP: 110/70  Pulse: 94  Height: 5\' 11"  (1.803 m)  Weight: 197 lb (89.4 kg)  SpO2: 98%  BMI (Calculated): 27.49    Physical Exam Vitals  reviewed.  Constitutional:      Appearance: Normal appearance.  HENT:     Head: Normocephalic.     Left Ear: There is no impacted cerumen.     Nose: Nose normal.     Mouth/Throat:     Mouth: Mucous membranes are moist.     Pharynx: No posterior oropharyngeal erythema.  Eyes:     Extraocular Movements: Extraocular movements intact.     Pupils: Pupils are equal, round, and reactive to light.  Cardiovascular:     Rate and Rhythm: Regular rhythm.     Chest Wall: PMI is not displaced.     Pulses: Normal pulses.     Heart sounds: Normal heart sounds. No murmur heard. Pulmonary:     Effort: Pulmonary effort is normal.     Breath sounds: Normal air entry. No rhonchi or rales.  Abdominal:     General: Abdomen is flat. Bowel sounds are normal. There is no distension.     Palpations: Abdomen is soft. There is no hepatomegaly, splenomegaly or mass.      Tenderness: There is no abdominal tenderness.  Musculoskeletal:        General: Normal range of motion.     Cervical back: Normal range of motion and neck supple.     Right lower leg: No edema.     Left lower leg: No edema.  Skin:    General: Skin is warm and dry.  Neurological:     General: No focal deficit present.     Mental Status: He is alert and oriented to person, place, and time.     Cranial Nerves: No cranial nerve deficit.     Motor: No weakness.  Psychiatric:        Mood and Affect: Mood normal.        Behavior: Behavior normal.      Results for orders placed or performed in visit on 06/01/23  POCT CBG (Fasting - Glucose)  Result Value Ref Range   Glucose Fasting, POC 84 70 - 99 mg/dL    Recent Results (from the past 2160 hour(Korri Ask))  POCT CBG (Fasting - Glucose)     Status: Abnormal   Collection Time: 03/23/23 11:50 AM  Result Value Ref Range   Glucose Fasting, POC 107 (A) 70 - 99 mg/dL  Lipid panel     Status: Abnormal   Collection Time: 05/31/23  9:19 AM  Result Value Ref Range   Cholesterol, Total 119 100 - 199 mg/dL   Triglycerides 191 (H) 0 - 149 mg/dL   HDL 24 (L) >47 mg/dL   VLDL Cholesterol Cal 29 5 - 40 mg/dL   LDL Chol Calc (NIH) 66 0 - 99 mg/dL   Chol/HDL Ratio 5.0 0.0 - 5.0 ratio    Comment:                                   T. Chol/HDL Ratio                                             Men  Women                               1/2 Avg.Risk  3.4    3.3  Avg.Risk  5.0    4.4                                2X Avg.Risk  9.6    7.1                                3X Avg.Risk 23.4   11.0   Comprehensive metabolic panel     Status: Abnormal   Collection Time: 05/31/23  9:19 AM  Result Value Ref Range   Glucose 129 (H) 70 - 99 mg/dL   BUN 14 8 - 27 mg/dL   Creatinine, Ser 4.09 (H) 0.76 - 1.27 mg/dL   eGFR 54 (L) >81 XB/JYN/8.29   BUN/Creatinine Ratio 10 10 - 24   Sodium 140 134 - 144 mmol/L   Potassium 4.6 3.5 - 5.2  mmol/L   Chloride 100 96 - 106 mmol/L   CO2 27 20 - 29 mmol/L   Calcium 9.6 8.6 - 10.2 mg/dL   Total Protein 7.4 6.0 - 8.5 g/dL   Albumin 4.6 3.9 - 4.9 g/dL   Globulin, Total 2.8 1.5 - 4.5 g/dL   Bilirubin Total 0.8 0.0 - 1.2 mg/dL   Alkaline Phosphatase 112 44 - 121 IU/L   AST 15 0 - 40 IU/L   ALT 14 0 - 44 IU/L  Hemoglobin A1c     Status: Abnormal   Collection Time: 05/31/23  9:19 AM  Result Value Ref Range   Hgb A1c MFr Bld 6.3 (H) 4.8 - 5.6 %    Comment:          Prediabetes: 5.7 - 6.4          Diabetes: >6.4          Glycemic control for adults with diabetes: <7.0    Est. average glucose Bld gHb Est-mCnc 134 mg/dL  POCT CBG (Fasting - Glucose)     Status: None   Collection Time: 06/01/23 10:03 AM  Result Value Ref Range   Glucose Fasting, POC 84 70 - 99 mg/dL      Assessment & Plan:   As per problem list  Problem List Items Addressed This Visit       Cardiovascular and Mediastinum   Essential hypertension     Endocrine   Type 2 diabetes mellitus without complication, without long-term current use of insulin (HCC) - Primary   Relevant Orders   POCT CBG (Fasting - Glucose) (Completed)   POC CREATINE & ALBUMIN,URINE     Genitourinary   CKD stage 3a, GFR 45-59 ml/min (HCC)   Relevant Orders   POC CREATINE & ALBUMIN,URINE   POCT Urinalysis Dipstick (56213)   US Renal   Renal function panel    Return in about 3 weeks (around 06/22/2023) for fu with labs prior, lab results, Imaging results.   Total time spent: 30 minutes  Luna Fuse, MD  06/01/2023   This document may have been prepared by [facility] Voice Recognition software and as such may include unintentional dictation errors.

## 2023-06-02 NOTE — Progress Notes (Signed)
Spoke with pt who verbalized understanding.

## 2023-06-08 ENCOUNTER — Other Ambulatory Visit: Payer: Medicare PPO

## 2023-06-14 ENCOUNTER — Other Ambulatory Visit: Payer: Self-pay | Admitting: Internal Medicine

## 2023-06-16 ENCOUNTER — Other Ambulatory Visit: Payer: Self-pay | Admitting: Internal Medicine

## 2023-06-19 ENCOUNTER — Other Ambulatory Visit: Payer: Self-pay | Admitting: Internal Medicine

## 2023-06-22 ENCOUNTER — Ambulatory Visit: Payer: Medicare PPO | Admitting: Internal Medicine

## 2023-06-22 ENCOUNTER — Other Ambulatory Visit: Payer: Medicare PPO

## 2023-06-28 ENCOUNTER — Ambulatory Visit: Payer: Medicare PPO | Admitting: Internal Medicine

## 2023-06-29 ENCOUNTER — Ambulatory Visit (INDEPENDENT_AMBULATORY_CARE_PROVIDER_SITE_OTHER): Payer: Medicare PPO

## 2023-06-29 DIAGNOSIS — N1831 Chronic kidney disease, stage 3a: Secondary | ICD-10-CM | POA: Diagnosis not present

## 2023-07-04 ENCOUNTER — Encounter: Payer: Self-pay | Admitting: Internal Medicine

## 2023-07-04 ENCOUNTER — Ambulatory Visit (INDEPENDENT_AMBULATORY_CARE_PROVIDER_SITE_OTHER): Payer: Medicare PPO | Admitting: Internal Medicine

## 2023-07-04 VITALS — BP 132/80 | HR 108 | Ht 71.0 in | Wt 200.2 lb

## 2023-07-04 DIAGNOSIS — N1831 Chronic kidney disease, stage 3a: Secondary | ICD-10-CM

## 2023-07-04 DIAGNOSIS — E119 Type 2 diabetes mellitus without complications: Secondary | ICD-10-CM | POA: Diagnosis not present

## 2023-07-04 DIAGNOSIS — N1330 Unspecified hydronephrosis: Secondary | ICD-10-CM | POA: Diagnosis not present

## 2023-07-04 LAB — POCT CBG (FASTING - GLUCOSE)-MANUAL ENTRY: Glucose Fasting, POC: 92 mg/dL (ref 70–99)

## 2023-07-04 NOTE — Progress Notes (Signed)
Established Patient Office Visit  Subjective:  Patient ID: Craig DANH Sr., male    DOB: 10/08/1955  Age: 67 y.o. MRN: [id number]  No chief complaint on file.   No new complaints, here for lab review and medication refills. USS notable for mod-severe R hydronephrosis with normal bladder and absence of stones.   No other concerns at this time.   Past Medical History:  Diagnosis Date   HLD (hyperlipidemia)    Hypertension    Myocardial infarction [facility])     Past Surgical History:  Procedure Laterality Date   CORONARY ANGIOPLASTY WITH STENT PLACEMENT     FEMORAL ARTERY STENT      Social History   Socioeconomic History   Marital status: Single    Spouse name: Not on file   Number of children: Not on file   Years of education: Not on file   Highest education level: Not on file  Occupational History   Not on file  Tobacco Use   Smoking status: Former    Current packs/day: 0.00    Average packs/day: 2.0 packs/day for 50.0 years (100.0 ttl pk-yrs)    Types: Cigarettes    Start date: 10/1972    Quit date: 10/2022    Years since quitting: 0.7   Smokeless tobacco: Never  Vaping Use   Vaping status: Never Used  Substance and Sexual Activity   Alcohol use: Not Currently   Drug use: Not Currently   Sexual activity: Not on file  Other Topics Concern   Not on file  Social History Narrative   Not on file   Social Determinants of Health   Financial Resource Strain: Not on file  Food Insecurity: Food Insecurity Present (07/23/2019)   [facility], [facility]   Hunger Vital Sign    Worried About Running Out of Food in the Last Year: Sometimes true    Ran Out of Food in the Last Year: Sometimes true  Transportation Needs: No Transportation Needs (05/26/2022)   PRAPARE - Administrator, Civil Service (Medical): No    Lack of Transportation (Non-Medical): No  Physical Activity: Insufficiently Active (07/23/2019)   Received from [facility], [facility]   Exercise Vital Sign    Days of Exercise per Week: 5 days    Minutes of Exercise per Session: 10 min  Stress: Not on file  Social Connections: Not on file  Intimate Partner Violence: Not on file    No family history on file.  No Known Allergies  Review of Systems  Constitutional: Negative.   HENT: Negative.    Eyes: Negative.   Respiratory: Negative.    Cardiovascular: Negative.   Gastrointestinal: Negative.   Genitourinary: Negative.   Skin: Negative.   Neurological: Negative.   Endo/Heme/Allergies: Negative.        Objective:   BP 132/80   Pulse (!) 108   Ht 5\' 11"  (1.803 m)   Wt 200 lb 3.2 oz (90.8 kg)   SpO2 97%   BMI 27.92 kg/m   Vitals:   07/04/23 1405  BP: 132/80  Pulse: (!) 108  Height: 5\' 11"  (1.803 m)  Weight: 200 lb 3.2 oz (90.8 kg)  SpO2: 97%  BMI (Calculated): 27.93    Physical Exam Vitals reviewed.  Constitutional:      Appearance: Normal appearance.  HENT:     Head: Normocephalic.     Left Ear: There is no impacted cerumen.  Nose: Nose normal.     Mouth/Throat:     Mouth: Mucous membranes are moist.     Pharynx: No posterior oropharyngeal erythema.  Eyes:     Extraocular Movements: Extraocular movements intact.     Pupils: Pupils are equal, round, and reactive to light.  Cardiovascular:     Rate and Rhythm: Regular rhythm.     Chest Wall: PMI is not displaced.     Pulses: Normal pulses.     Heart sounds: Normal heart sounds. No murmur heard. Pulmonary:     Effort: Pulmonary effort is normal.     Breath sounds: Normal air entry. No rhonchi or rales.  Abdominal:     General: Abdomen is flat. Bowel sounds are normal. There is no distension.     Palpations: Abdomen is soft. There is no hepatomegaly, splenomegaly or mass.     Tenderness: There is no abdominal tenderness. There is no right CVA tenderness or left CVA tenderness.  Musculoskeletal:        General: Normal range of motion.     Cervical  back: Normal range of motion and neck supple.     Right lower leg: No edema.     Left lower leg: No edema.  Skin:    General: Skin is warm and dry.  Neurological:     General: No focal deficit present.     Mental Status: He is alert and oriented to person, place, and time.     Cranial Nerves: No cranial nerve deficit.     Motor: No weakness.  Psychiatric:        Mood and Affect: Mood normal.        Behavior: Behavior normal.      Results for orders placed or performed in visit on 07/04/23  POCT CBG (Fasting - Glucose)  Result Value Ref Range   Glucose Fasting, POC 92 70 - 99 mg/dL    Recent Results (from the past 2160 hour(Lorelle Macaluso))  Lipid panel     Status: Abnormal   Collection Time: 05/31/23  9:19 AM  Result Value Ref Range   Cholesterol, Total 119 100 - 199 mg/dL   Triglycerides 409 (H) 0 - 149 mg/dL   HDL 24 (L) >81 mg/dL   VLDL Cholesterol Cal 29 5 - 40 mg/dL   LDL Chol Calc (NIH) 66 0 - 99 mg/dL   Chol/HDL Ratio 5.0 0.0 - 5.0 ratio    Comment:                                   T. Chol/HDL Ratio                                             Men  Women                               1/2 Avg.Risk  3.4    3.3                                   Avg.Risk  5.0    4.4  2X Avg.Risk  9.6    7.1                                3X Avg.Risk 23.4   11.0   Comprehensive metabolic panel     Status: Abnormal   Collection Time: 05/31/23  9:19 AM  Result Value Ref Range   Glucose 129 (H) 70 - 99 mg/dL   BUN 14 8 - 27 mg/dL   Creatinine, Ser 2.95 (H) 0.76 - 1.27 mg/dL   eGFR 54 (L) >28 UX/LKG/4.01   BUN/Creatinine Ratio 10 10 - 24   Sodium 140 134 - 144 mmol/L   Potassium 4.6 3.5 - 5.2 mmol/L   Chloride 100 96 - 106 mmol/L   CO2 27 20 - 29 mmol/L   Calcium 9.6 8.6 - 10.2 mg/dL   Total Protein 7.4 6.0 - 8.5 g/dL   Albumin 4.6 3.9 - 4.9 g/dL   Globulin, Total 2.8 1.5 - 4.5 g/dL   Bilirubin Total 0.8 0.0 - 1.2 mg/dL   Alkaline Phosphatase 112 44 - 121 IU/L    AST 15 0 - 40 IU/L   ALT 14 0 - 44 IU/L  Hemoglobin A1c     Status: Abnormal   Collection Time: 05/31/23  9:19 AM  Result Value Ref Range   Hgb A1c MFr Bld 6.3 (H) 4.8 - 5.6 %    Comment:          Prediabetes: 5.7 - 6.4          Diabetes: >6.4          Glycemic control for adults with diabetes: <7.0    Est. average glucose Bld gHb Est-mCnc 134 mg/dL  POCT CBG (Fasting - Glucose)     Status: None   Collection Time: 06/01/23 10:03 AM  Result Value Ref Range   Glucose Fasting, POC 84 70 - 99 mg/dL  POCT Urinalysis Dipstick (02725)     Status: None   Collection Time: 06/01/23 11:19 AM  Result Value Ref Range   Color, UA yellow    Clarity, UA     Glucose, UA Negative Negative   Bilirubin, UA neg    Ketones, UA neg    Spec Grav, UA 1.015 1.010 - 1.025   Blood, UA neg    pH, UA 6.0 5.0 - 8.0   Protein, UA Negative Negative   Urobilinogen, UA 0.2 0.2 or 1.0 E.U./dL   Nitrite, UA neg    Leukocytes, UA Negative Negative   Appearance clear    Odor    POC CREATINE & ALBUMIN,URINE     Status: None   Collection Time: 06/01/23 11:58 AM  Result Value Ref Range   Microalbumin Ur, POC 10 mg/L   Creatinine, POC 50 mg/dL   Albumin/Creatinine Ratio, Urine, POC <30   POCT CBG (Fasting - Glucose)     Status: None   Collection Time: 07/04/23  2:12 PM  Result Value Ref Range   Glucose Fasting, POC 92 70 - 99 mg/dL      Assessment & Plan:  As per problem list. Reminded again to avoid NSAIDS. Problem List Items Addressed This Visit       Endocrine   Type 2 diabetes mellitus without complication, without long-term current use of insulin (HCC) - Primary   Relevant Orders   POCT CBG (Fasting - Glucose) (Completed)     Genitourinary   Hydronephrosis    Return in  about 3 weeks (around 07/25/2023) for keep appt with me, fu with labs prior.   Total time spent: 30 minutes  Luna Fuse, MD  07/04/2023   This document may have been prepared by [facility] Voice Recognition  software and as such may include unintentional dictation errors.

## 2023-07-25 ENCOUNTER — Ambulatory Visit: Payer: Medicare PPO | Admitting: Internal Medicine

## 2023-07-26 ENCOUNTER — Other Ambulatory Visit: Payer: Self-pay | Admitting: Internal Medicine

## 2023-07-26 DIAGNOSIS — E119 Type 2 diabetes mellitus without complications: Secondary | ICD-10-CM

## 2023-07-27 ENCOUNTER — Other Ambulatory Visit: Payer: Self-pay | Admitting: Internal Medicine

## 2023-08-11 ENCOUNTER — Encounter: Payer: Self-pay | Admitting: Urology

## 2023-08-11 ENCOUNTER — Ambulatory Visit: Payer: Medicare PPO | Admitting: Urology

## 2023-08-11 VITALS — BP 106/75 | HR 76 | Ht 71.0 in | Wt 197.0 lb

## 2023-08-11 DIAGNOSIS — N1330 Unspecified hydronephrosis: Secondary | ICD-10-CM

## 2023-08-11 DIAGNOSIS — N281 Cyst of kidney, acquired: Secondary | ICD-10-CM

## 2023-08-11 LAB — URINALYSIS, COMPLETE
Bilirubin, UA: NEGATIVE
Glucose, UA: NEGATIVE
Ketones, UA: NEGATIVE
Leukocytes,UA: NEGATIVE
Nitrite, UA: NEGATIVE
Protein,UA: NEGATIVE
RBC, UA: NEGATIVE
Specific Gravity, UA: 1.015 (ref 1.005–1.030)
Urobilinogen, Ur: 0.2 mg/dL (ref 0.2–1.0)
pH, UA: 7 (ref 5.0–7.5)

## 2023-08-11 LAB — MICROSCOPIC EXAMINATION: Bacteria, UA: NONE SEEN

## 2023-08-11 NOTE — Progress Notes (Signed)
Craig Potter,acting as a scribe for Riki Altes, MD.,have documented all relevant documentation on the behalf of Riki Altes, MD,as directed by  Riki Altes, MD while in the presence of Riki Altes, MD.  08/11/2023 11:13 AM   Craig Potter Sr. 08-Oct-1955 [id number]  Referring provider: [name], MD [address] [place],  [place] [address]  Chief Complaint  Patient presents with   Hydronephrosis    HPI: Craig Potter Sr. is a 67 y.o. male who is referred for evaluation of right hydronephrosis.  Renal ultrasound ordered by PCP for evaluation of CKD and was performed in his office on 06/29/2023 with findings of moderate to severe right hydronephrosis He denies flank or abdominal pain. He has occasional bilateral low back pain, which he attributes to an accident several years ago No bothersome lower urinary tract symptoms Denies dysuria or gross hematuria No significant PVR on renal ultrasound. The images were not available for review He did have several CT scans in July and August 2023, which showed a large right parapelvic cyst and no hydronephrosis.   PMH: Past Medical History:  Diagnosis Date   HLD (hyperlipidemia)    Hypertension    Myocardial infarction [facility])     Surgical History: Past Surgical History:  Procedure Laterality Date   CORONARY ANGIOPLASTY WITH STENT PLACEMENT     FEMORAL ARTERY STENT      Home Medications:  Allergies as of 08/11/2023   No Known Allergies      Medication List        Accurate as of August 11, 2023 11:13 AM. If you have any questions, ask your nurse or doctor.          amLODipine 10 MG tablet Commonly known as: NORVASC TAKE 1 TABLET BY MOUTH EVERY MORNING   aspirin EC 81 MG tablet Take 81 mg by mouth daily. Swallow whole.   atorvastatin 20 MG tablet Commonly known as: LIPITOR Take 1 tablet (20 mg total) by mouth every evening.   carvedilol 12.5 MG tablet Commonly known as: COREG TAKE  1 TABLET BY MOUTH TWICE DAILY   cefadroxil 500 MG capsule Commonly known as: DURICEF Take 500 mg by mouth 2 (two) times daily.   escitalopram 10 MG tablet Commonly known as: LEXAPRO TAKE 1 TABLET BY MOUTH EVERY MORNING   hydrochlorothiazide 25 MG tablet Commonly known as: HYDRODIURIL TAKE 1 TABLET BY MOUTH EVERY MORNING   levofloxacin 500 MG tablet Commonly known as: LEVAQUIN Take 500 mg by mouth daily.   metFORMIN 500 MG 24 hr tablet Commonly known as: GLUCOPHAGE-XR TAKE 1 TABLET BY MOUTH EVERY DAY WITH BREAKFAST   multivitamin tablet Take 1 tablet by mouth daily.   nitroGLYCERIN 0.4 MG SL tablet Commonly known as: NITROSTAT Place 0.4 mg under the tongue every 5 (five) minutes as needed for chest pain.   ondansetron 4 MG disintegrating tablet Commonly known as: ZOFRAN-ODT Take 1 tablet (4 mg total) by mouth every 8 (eight) hours as needed for nausea or vomiting.   sildenafil 20 MG tablet Commonly known as: REVATIO Take 20-100 mg by mouth as needed (for sex).   tamsulosin 0.4 MG Caps capsule Commonly known as: FLOMAX TAKE 1 CAPSULE BY MOUTH EVERY MORNING   traMADol 50 MG tablet Commonly known as: ULTRAM Take 50 mg by mouth 3 (three) times daily as needed.   traZODone 50 MG tablet Commonly known as: DESYREL TAKE 1 TO 2 TABLETS BY MOUTH AT BEDTIME AS  NEEDED FOR SLEEP   triamcinolone ointment 0.5 % Commonly known as: KENALOG Apply 1 Application topically 2 (two) times daily.        Social History:  reports that he quit smoking about 10 months ago. His smoking use included cigarettes. He started smoking about 50 years ago. He has a 100 pack-year smoking history. He has never used smokeless tobacco. He reports that he does not currently use alcohol. He reports that he does not currently use drugs.   Physical Exam: BP 106/75   Pulse 76   Ht 5\' 11"  (1.803 m)   Wt 197 lb (89.4 kg)   BMI 27.48 kg/m   Constitutional:  Alert and oriented, No acute  distress. HEENT: [place] AT, moist mucus membranes.  Trachea midline, no masses. Neurologic: Grossly intact, no focal deficits, moving all 4 extremities. Psychiatric: Normal mood and affect.  Laboratory: Urinalysis: Dipstick/microscopy negative   Assessment & Plan:    1. Parapelvic cyst The large right parapelvic cyst could be easily interpreted as hydronephrosis on a renal ultrasound. Imaging with contrast in 04/2022 shows this to be a parapelvic cyst and not hydronephrosis Findings were discussed with the patient and no further evaluation is needed   I have reviewed the above documentation for accuracy and completeness, and I agree with the above.   Riki Altes, MD  [facility] [address], [address] [place], [place] [address] [phone]

## 2023-08-16 ENCOUNTER — Ambulatory Visit (INDEPENDENT_AMBULATORY_CARE_PROVIDER_SITE_OTHER): Payer: Medicare PPO | Admitting: Internal Medicine

## 2023-08-16 ENCOUNTER — Encounter: Payer: Self-pay | Admitting: Internal Medicine

## 2023-08-16 VITALS — BP 118/82 | HR 109 | Ht 71.0 in | Wt 199.8 lb

## 2023-08-16 DIAGNOSIS — E782 Mixed hyperlipidemia: Secondary | ICD-10-CM | POA: Diagnosis not present

## 2023-08-16 DIAGNOSIS — F3289 Other specified depressive episodes: Secondary | ICD-10-CM

## 2023-08-16 DIAGNOSIS — N1831 Chronic kidney disease, stage 3a: Secondary | ICD-10-CM

## 2023-08-16 DIAGNOSIS — Z0130 Encounter for examination of blood pressure without abnormal findings: Secondary | ICD-10-CM

## 2023-08-16 DIAGNOSIS — N401 Enlarged prostate with lower urinary tract symptoms: Secondary | ICD-10-CM | POA: Diagnosis not present

## 2023-08-16 DIAGNOSIS — R198 Other specified symptoms and signs involving the digestive system and abdomen: Secondary | ICD-10-CM | POA: Diagnosis not present

## 2023-08-16 DIAGNOSIS — R351 Nocturia: Secondary | ICD-10-CM

## 2023-08-16 DIAGNOSIS — G4701 Insomnia due to medical condition: Secondary | ICD-10-CM

## 2023-08-16 DIAGNOSIS — E119 Type 2 diabetes mellitus without complications: Secondary | ICD-10-CM | POA: Diagnosis not present

## 2023-08-16 LAB — POCT CBG (FASTING - GLUCOSE)-MANUAL ENTRY: Glucose Fasting, POC: 102 mg/dL — AB (ref 70–99)

## 2023-08-16 MED ORDER — ESCITALOPRAM OXALATE 10 MG PO TABS: 10.0000 mg | ORAL_TABLET | Freq: Every morning | ORAL | 1 refills | Status: DC

## 2023-08-16 MED ORDER — TRAZODONE HCL 50 MG PO TABS: 50.0000 mg | ORAL_TABLET | Freq: Every evening | ORAL | 0 refills | Status: DC | PRN

## 2023-08-16 MED ORDER — ATORVASTATIN CALCIUM 20 MG PO TABS: 20.0000 mg | ORAL_TABLET | Freq: Every evening | ORAL | 0 refills | Status: DC

## 2023-08-16 MED ORDER — TAMSULOSIN HCL 0.4 MG PO CAPS: 0.4000 mg | ORAL_CAPSULE | Freq: Every morning | ORAL | 1 refills | Status: DC

## 2023-08-16 NOTE — Progress Notes (Signed)
Established Patient Office Visit  Subjective:  Patient ID: Craig PRESS Sr., male    DOB: 1956/01/02  Age: 67 y.o. MRN: [id number]  Chief Complaint  Patient presents with   Follow-up    3 week follow up    No new complaints, here for lab review and medication refills. Urology referral noted and clarified renal uss findings as stable r parapelvic cyst not hydronephrosis.    No other concerns at this time.   Past Medical History:  Diagnosis Date   HLD (hyperlipidemia)    Hypertension    Myocardial infarction [facility])     Past Surgical History:  Procedure Laterality Date   CORONARY ANGIOPLASTY WITH STENT PLACEMENT     FEMORAL ARTERY STENT      Social History   Socioeconomic History   Marital status: Single    Spouse name: Not on file   Number of children: Not on file   Years of education: Not on file   Highest education level: Not on file  Occupational History   Not on file  Tobacco Use   Smoking status: Former    Current packs/day: 0.00    Average packs/day: 2.0 packs/day for 50.0 years (100.0 ttl pk-yrs)    Types: Cigarettes    Start date: 10/1972    Quit date: 10/2022    Years since quitting: 0.8   Smokeless tobacco: Never  Vaping Use   Vaping status: Never Used  Substance and Sexual Activity   Alcohol use: Not Currently   Drug use: Not Currently   Sexual activity: Not on file  Other Topics Concern   Not on file  Social History Narrative   Not on file   Social Determinants of Health   Financial Resource Strain: Not on file  Food Insecurity: Food Insecurity Present (07/23/2019)   [facility]   Hunger Vital Sign    Worried About Running Out of Food in the Last Year: Sometimes true    Ran Out of Food in the Last Year: Sometimes true  Transportation Needs: No Transportation Needs (05/26/2022)   PRAPARE - Administrator, Civil Service (Medical): No    Lack of Transportation (Non-Medical): No  Physical  Activity: Insufficiently Active (07/23/2019)   Received from [facility], [facility]   Exercise Vital Sign    Days of Exercise per Week: 5 days    Minutes of Exercise per Session: 10 min  Stress: Not on file  Social Connections: Not on file  Intimate Partner Violence: Not on file    No family history on file.  No Known Allergies  Review of Systems  Constitutional: Negative.   HENT: Negative.    Eyes: Negative.   Respiratory: Negative.    Cardiovascular: Negative.   Gastrointestinal: Negative.   Genitourinary: Negative.   Skin: Negative.   Neurological: Negative.   Endo/Heme/Allergies: Negative.        Objective:   BP 118/82   Pulse (!) 109   Ht 5\' 11"  (1.803 m)   Wt 199 lb 12.8 oz (90.6 kg)   SpO2 99%   BMI 27.87 kg/m   Vitals:   08/16/23 0948  BP: 118/82  Pulse: (!) 109  Height: 5\' 11"  (1.803 m)  Weight: 199 lb 12.8 oz (90.6 kg)  SpO2: 99%  BMI (Calculated): 27.88    Physical Exam Vitals reviewed.  Constitutional:      Appearance: Normal appearance.  HENT:     Head:  Normocephalic.     Left Ear: There is no impacted cerumen.     Nose: Nose normal.     Mouth/Throat:     Mouth: Mucous membranes are moist.     Pharynx: No posterior oropharyngeal erythema.  Eyes:     Extraocular Movements: Extraocular movements intact.     Pupils: Pupils are equal, round, and reactive to light.  Cardiovascular:     Rate and Rhythm: Regular rhythm.     Chest Wall: PMI is not displaced.     Pulses: Normal pulses.     Heart sounds: Normal heart sounds. No murmur heard. Pulmonary:     Effort: Pulmonary effort is normal.     Breath sounds: Normal air entry. No rhonchi or rales.  Abdominal:     General: Abdomen is flat. Bowel sounds are normal. There is no distension.     Palpations: Abdomen is soft. There is no hepatomegaly, splenomegaly or mass.     Tenderness: There is no abdominal tenderness. There is no right CVA tenderness or left CVA tenderness.   Musculoskeletal:        General: Normal range of motion.     Cervical back: Normal range of motion and neck supple.     Right lower leg: No edema.     Left lower leg: No edema.  Skin:    General: Skin is warm and dry.  Neurological:     General: No focal deficit present.     Mental Status: He is alert and oriented to person, place, and time.     Cranial Nerves: No cranial nerve deficit.     Motor: No weakness.  Psychiatric:        Mood and Affect: Mood normal.        Behavior: Behavior normal.      Results for orders placed or performed in visit on 08/16/23  POCT CBG (Fasting - Glucose)  Result Value Ref Range   Glucose Fasting, POC 102 (A) 70 - 99 mg/dL        Assessment & Plan:  As per problem list  Problem List Items Addressed This Visit       Endocrine   Type 2 diabetes mellitus without complication, without long-term current use of insulin (HCC) - Primary   Relevant Medications   atorvastatin (LIPITOR) 20 MG tablet   Other Relevant Orders   POCT CBG (Fasting - Glucose) (Completed)   Hemoglobin A1c     Genitourinary   CKD stage 3a, GFR 45-59 ml/min (HCC)   Relevant Orders   Comprehensive metabolic panel     Other   Pelvic cyst in male   Other Visit Diagnoses     Mixed hyperlipidemia       Relevant Medications   atorvastatin (LIPITOR) 20 MG tablet   Other Relevant Orders   Lipid panel   BPH associated with nocturia       Relevant Medications   tamsulosin (FLOMAX) 0.4 MG CAPS capsule   Other depression       Relevant Medications   traZODone (DESYREL) 50 MG tablet   escitalopram (LEXAPRO) 10 MG tablet   Insomnia due to medical condition       Relevant Medications   traZODone (DESYREL) 50 MG tablet       Return in about 3 weeks (around 09/06/2023) for fu with labs prior.   Total time spent: 20 minutes  Luna Fuse, MD  08/16/2023   This document may have been prepared by [employer] Voice  Recognition software and as such may  include unintentional dictation errors.

## 2023-08-18 ENCOUNTER — Other Ambulatory Visit: Payer: Self-pay | Admitting: Internal Medicine

## 2023-08-18 DIAGNOSIS — N401 Enlarged prostate with lower urinary tract symptoms: Secondary | ICD-10-CM

## 2023-09-05 ENCOUNTER — Other Ambulatory Visit: Payer: Medicare PPO

## 2023-09-05 DIAGNOSIS — E782 Mixed hyperlipidemia: Secondary | ICD-10-CM | POA: Diagnosis not present

## 2023-09-05 DIAGNOSIS — N1831 Chronic kidney disease, stage 3a: Secondary | ICD-10-CM

## 2023-09-05 DIAGNOSIS — E119 Type 2 diabetes mellitus without complications: Secondary | ICD-10-CM

## 2023-09-06 ENCOUNTER — Encounter: Payer: Self-pay | Admitting: Internal Medicine

## 2023-09-06 ENCOUNTER — Ambulatory Visit: Payer: Medicare PPO | Admitting: Internal Medicine

## 2023-09-06 VITALS — BP 112/82 | HR 111 | Ht 71.0 in | Wt 200.8 lb

## 2023-09-06 DIAGNOSIS — Z0130 Encounter for examination of blood pressure without abnormal findings: Secondary | ICD-10-CM

## 2023-09-06 DIAGNOSIS — R351 Nocturia: Secondary | ICD-10-CM | POA: Diagnosis not present

## 2023-09-06 DIAGNOSIS — N401 Enlarged prostate with lower urinary tract symptoms: Secondary | ICD-10-CM

## 2023-09-06 DIAGNOSIS — I739 Peripheral vascular disease, unspecified: Secondary | ICD-10-CM

## 2023-09-06 DIAGNOSIS — E119 Type 2 diabetes mellitus without complications: Secondary | ICD-10-CM

## 2023-09-06 LAB — LIPID PANEL
Chol/HDL Ratio: 3.7 {ratio} (ref 0.0–5.0)
Cholesterol, Total: 96 mg/dL — ABNORMAL LOW (ref 100–199)
HDL: 26 mg/dL — ABNORMAL LOW (ref >39)
LDL Chol Calc (NIH): 49 mg/dL (ref 0–99)
Triglycerides: 114 mg/dL (ref 0–149)
VLDL Cholesterol Cal: 21 mg/dL (ref 5–40)

## 2023-09-06 LAB — HEPATIC FUNCTION PANEL
ALT: 12 [IU]/L (ref 0–44)
AST: 14 [IU]/L (ref 0–40)
Albumin: 4.4 g/dL (ref 3.9–4.9)
Alkaline Phosphatase: 110 [IU]/L (ref 44–121)
Bilirubin Total: 1.1 mg/dL (ref 0.0–1.2)
Bilirubin, Direct: 0.35 mg/dL (ref 0.00–0.40)
Total Protein: 7.4 g/dL (ref 6.0–8.5)

## 2023-09-06 LAB — COMPREHENSIVE METABOLIC PANEL
ALT: 13 [IU]/L (ref 0–44)
AST: 15 [IU]/L (ref 0–40)
Albumin: 4.5 g/dL (ref 3.9–4.9)
Alkaline Phosphatase: 105 [IU]/L (ref 44–121)
BUN/Creatinine Ratio: 10 (ref 10–24)
BUN: 10 mg/dL (ref 8–27)
Bilirubin Total: 1.1 mg/dL (ref 0.0–1.2)
CO2: 23 mmol/L (ref 20–29)
Calcium: 9.3 mg/dL (ref 8.6–10.2)
Chloride: 99 mmol/L (ref 96–106)
Creatinine, Ser: 1.04 mg/dL (ref 0.76–1.27)
Globulin, Total: 2.7 g/dL (ref 1.5–4.5)
Glucose: 86 mg/dL (ref 70–99)
Potassium: 4.2 mmol/L (ref 3.5–5.2)
Sodium: 138 mmol/L (ref 134–144)
Total Protein: 7.2 g/dL (ref 6.0–8.5)
eGFR: 79 mL/min/{1.73_m2} (ref >59)

## 2023-09-06 LAB — POCT CBG (FASTING - GLUCOSE)-MANUAL ENTRY: Glucose Fasting, POC: 94 mg/dL (ref 70–99)

## 2023-09-06 LAB — HEMOGLOBIN A1C
Est. average glucose Bld gHb Est-mCnc: 143 mg/dL
Hgb A1c MFr Bld: 6.6 % — ABNORMAL HIGH (ref 4.8–5.6)

## 2023-09-06 MED ORDER — TAMSULOSIN HCL 0.4 MG PO CAPS: 0.4000 mg | ORAL_CAPSULE | Freq: Every morning | ORAL | 1 refills | Status: DC

## 2023-09-06 NOTE — Progress Notes (Signed)
Established Patient Office Visit  Subjective:  Patient ID: Craig STUEDEMANN Sr., male    DOB: Feb 26, 1956  Age: 67 y.o. MRN: [id number]  Chief Complaint  Patient presents with   Follow-up    No new complaints, here for lab review and medication refills. Labs reviewed and notable for well controlled diabetes, A1c higjher but at target, lipids at target with cmp notable for normalization of renal function and improvement in LDL. Denies any hypoglycemic episodes and home bg readings have been at target.   No other concerns at this time.   Past Medical History:  Diagnosis Date   HLD (hyperlipidemia)    Hypertension    Myocardial infarction [facility])     Past Surgical History:  Procedure Laterality Date   CORONARY ANGIOPLASTY WITH STENT PLACEMENT     FEMORAL ARTERY STENT      Social History   Socioeconomic History   Marital status: Single    Spouse name: Not on file   Number of children: Not on file   Years of education: Not on file   Highest education level: Not on file  Occupational History   Not on file  Tobacco Use   Smoking status: Former    Current packs/day: 0.00    Average packs/day: 2.0 packs/day for 50.0 years (100.0 ttl pk-yrs)    Types: Cigarettes    Start date: 10/1972    Quit date: 10/2022    Years since quitting: 0.9   Smokeless tobacco: Never  Vaping Use   Vaping status: Never Used  Substance and Sexual Activity   Alcohol use: Not Currently   Drug use: Not Currently   Sexual activity: Not on file  Other Topics Concern   Not on file  Social History Narrative   Not on file   Social Determinants of Health   Financial Resource Strain: Not on file  Food Insecurity: Food Insecurity Present (07/23/2019)   Received [facility]   Hunger Vital Sign    Worried About Running Out of Food in the Last Year: Sometimes true    Ran Out of Food in the Last Year: Sometimes true  Transportation Needs: No Transportation Needs (05/26/2022)    PRAPARE - Administrator, Civil Service (Medical): No    Lack of Transportation (Non-Medical): No  Physical Activity: Insufficiently Active (07/23/2019)   Received from [facility], [facility]   Exercise Vital Sign    Days of Exercise per Week: 5 days    Minutes of Exercise per Session: 10 min  Stress: Not on file  Social Connections: Not on file  Intimate Partner Violence: Not on file    No family history on file.  No Known Allergies  Outpatient Medications Prior to Visit  Medication Sig   amLODipine (NORVASC) 10 MG tablet TAKE 1 TABLET BY MOUTH EVERY MORNING   aspirin EC 81 MG tablet Take 81 mg by mouth daily. Swallow whole.   atorvastatin (LIPITOR) 20 MG tablet Take 1 tablet (20 mg total) by mouth every evening.   carvedilol (COREG) 12.5 MG tablet TAKE 1 TABLET BY MOUTH TWICE DAILY   cefadroxil (DURICEF) 500 MG capsule Take 500 mg by mouth 2 (two) times daily.   escitalopram (LEXAPRO) 10 MG tablet Take 1 tablet (10 mg total) by mouth every morning.   hydrochlorothiazide (HYDRODIURIL) 25 MG tablet TAKE 1 TABLET BY MOUTH EVERY MORNING   metFORMIN (GLUCOPHAGE-XR) 500 MG 24 hr tablet TAKE 1 TABLET BY  MOUTH EVERY DAY WITH BREAKFAST   nitroGLYCERIN (NITROSTAT) 0.4 MG SL tablet Place 0.4 mg under the tongue every 5 (five) minutes as needed for chest pain. (Patient not taking: Reported on 08/16/2023)   sildenafil (REVATIO) 20 MG tablet Take 20-100 mg by mouth as needed (for sex).   traZODone (DESYREL) 50 MG tablet Take 1-2 tablets (50-100 mg total) by mouth at bedtime as needed. for sleep   [DISCONTINUED] tamsulosin (FLOMAX) 0.4 MG CAPS capsule TAKE 1 CAPSULE BY MOUTH EVERY MORNING   No facility-administered medications prior to visit.    Review of Systems  Constitutional: Negative.   HENT: Negative.    Eyes: Negative.   Respiratory: Negative.    Cardiovascular: Negative.   Gastrointestinal: Negative.   Genitourinary: Negative.   Skin: Negative.    Neurological: Negative.   Endo/Heme/Allergies: Negative.        Objective:   BP 112/82   Pulse (!) 111   Ht 5\' 11"  (1.803 m)   Wt 200 lb 12.8 oz (91.1 kg)   SpO2 97%   BMI 28.01 kg/m   Vitals:   09/06/23 1144  BP: 112/82  Pulse: (!) 111  Height: 5\' 11"  (1.803 m)  Weight: 200 lb 12.8 oz (91.1 kg)  SpO2: 97%  BMI (Calculated): 28.02    Physical Exam Vitals reviewed.  Constitutional:      Appearance: Normal appearance.  HENT:     Head: Normocephalic.     Left Ear: There is no impacted cerumen.     Nose: Nose normal.     Mouth/Throat:     Mouth: Mucous membranes are moist.     Pharynx: No posterior oropharyngeal erythema.  Eyes:     Extraocular Movements: Extraocular movements intact.     Pupils: Pupils are equal, round, and reactive to light.  Cardiovascular:     Rate and Rhythm: Regular rhythm.     Chest Wall: PMI is not displaced.     Pulses: Normal pulses.     Heart sounds: Normal heart sounds. No murmur heard. Pulmonary:     Effort: Pulmonary effort is normal.     Breath sounds: Normal air entry. No rhonchi or rales.  Abdominal:     General: Abdomen is flat. Bowel sounds are normal. There is no distension.     Palpations: Abdomen is soft. There is no hepatomegaly, splenomegaly or mass.     Tenderness: There is no abdominal tenderness. There is no right CVA tenderness or left CVA tenderness.  Musculoskeletal:        General: Normal range of motion.     Cervical back: Normal range of motion and neck supple.     Right lower leg: No edema.     Left lower leg: No edema.  Skin:    General: Skin is warm and dry.  Neurological:     General: No focal deficit present.     Mental Status: He is alert and oriented to person, place, and time.     Cranial Nerves: No cranial nerve deficit.     Motor: No weakness.  Psychiatric:        Mood and Affect: Mood normal.        Behavior: Behavior normal.      Results for orders placed or performed in visit on  09/06/23  POCT CBG (Fasting - Glucose)  Result Value Ref Range   Glucose Fasting, POC 94 70 - 99 mg/dL    Recent Results (from the past 2160 hour(Shaunak Kreis))  POCT CBG (  Fasting - Glucose)     Status: None   Collection Time: 07/04/23  2:12 PM  Result Value Ref Range   Glucose Fasting, POC 92 70 - 99 mg/dL  Urinalysis, Complete     Status: None   Collection Time: 08/11/23 10:41 AM  Result Value Ref Range   Specific Gravity, UA 1.015 1.005 - 1.030   pH, UA 7.0 5.0 - 7.5   Color, UA Yellow Yellow   Appearance Ur Clear Clear   Leukocytes,UA Negative Negative   Protein,UA Negative Negative/Trace   Glucose, UA Negative Negative   Ketones, UA Negative Negative   RBC, UA Negative Negative   Bilirubin, UA Negative Negative   Urobilinogen, Ur 0.2 0.2 - 1.0 mg/dL   Nitrite, UA Negative Negative   Microscopic Examination See below:   Microscopic Examination     Status: None   Collection Time: 08/11/23 10:41 AM   Urine  Result Value Ref Range   WBC, UA 0-5 0 - 5 /hpf   RBC, Urine 0-2 0 - 2 /hpf   Epithelial Cells (non renal) 0-10 0 - 10 /hpf   Bacteria, UA None seen None seen/Few  POCT CBG (Fasting - Glucose)     Status: Abnormal   Collection Time: 08/16/23  9:55 AM  Result Value Ref Range   Glucose Fasting, POC 102 (A) 70 - 99 mg/dL  Lipid panel     Status: Abnormal   Collection Time: 09/05/23  2:58 PM  Result Value Ref Range   Cholesterol, Total 96 (L) 100 - 199 mg/dL   Triglycerides 960 0 - 149 mg/dL   HDL 26 (L) >45 mg/dL   VLDL Cholesterol Cal 21 5 - 40 mg/dL   LDL Chol Calc (NIH) 49 0 - 99 mg/dL   Chol/HDL Ratio 3.7 0.0 - 5.0 ratio    Comment:                                   T. Chol/HDL Ratio                                             Men  Women                               1/2 Avg.Risk  3.4    3.3                                   Avg.Risk  5.0    4.4                                2X Avg.Risk  9.6    7.1                                3X Avg.Risk 23.4   11.0    Comprehensive metabolic panel     Status: None   Collection Time: 09/05/23  2:58 PM  Result Value Ref Range   Glucose 86 70 - 99 mg/dL   BUN 10 8 - 27 mg/dL   Creatinine, Ser  1.04 0.76 - 1.27 mg/dL   eGFR 79 >13 KG/MWN/0.27   BUN/Creatinine Ratio 10 10 - 24   Sodium 138 134 - 144 mmol/L   Potassium 4.2 3.5 - 5.2 mmol/L   Chloride 99 96 - 106 mmol/L   CO2 23 20 - 29 mmol/L   Calcium 9.3 8.6 - 10.2 mg/dL   Total Protein 7.2 6.0 - 8.5 g/dL   Albumin 4.5 3.9 - 4.9 g/dL   Globulin, Total 2.7 1.5 - 4.5 g/dL   Bilirubin Total 1.1 0.0 - 1.2 mg/dL   Alkaline Phosphatase 105 44 - 121 IU/L   AST 15 0 - 40 IU/L   ALT 13 0 - 44 IU/L  Hemoglobin A1c     Status: Abnormal   Collection Time: 09/05/23  2:58 PM  Result Value Ref Range   Hgb A1c MFr Bld 6.6 (H) 4.8 - 5.6 %    Comment:          Prediabetes: 5.7 - 6.4          Diabetes: >6.4          Glycemic control for adults with diabetes: <7.0    Est. average glucose Bld gHb Est-mCnc 143 mg/dL  Hepatic function panel     Status: None   Collection Time: 09/05/23  3:03 PM  Result Value Ref Range   Total Protein 7.4 6.0 - 8.5 g/dL   Albumin 4.4 3.9 - 4.9 g/dL   Bilirubin Total 1.1 0.0 - 1.2 mg/dL   Bilirubin, Direct 2.53 0.00 - 0.40 mg/dL   Alkaline Phosphatase 110 44 - 121 IU/L   AST 14 0 - 40 IU/L   ALT 12 0 - 44 IU/L  POCT CBG (Fasting - Glucose)     Status: None   Collection Time: 09/06/23 11:52 AM  Result Value Ref Range   Glucose Fasting, POC 94 70 - 99 mg/dL      Assessment & Plan:  As per problem list  Problem List Items Addressed This Visit       Endocrine   Type 2 diabetes mellitus without complication, without long-term current use of insulin (HCC) - Primary   Relevant Orders   POCT CBG (Fasting - Glucose) (Completed)   PR CUSTOM SHOES DEPTH INLAY   Other Visit Diagnoses     BPH associated with nocturia       Relevant Medications   tamsulosin (FLOMAX) 0.4 MG CAPS capsule       Return in about 3 months (around  12/05/2023) for awv with labs prior.   Total time spent: 30 minutes  Luna Fuse, MD  09/06/2023   This document may have been prepared by [facility] Voice Recognition software and as such may include unintentional dictation errors.

## 2023-09-09 ENCOUNTER — Other Ambulatory Visit: Payer: Self-pay | Admitting: Internal Medicine

## 2023-09-19 ENCOUNTER — Ambulatory Visit: Payer: Medicare PPO

## 2023-09-19 DIAGNOSIS — I739 Peripheral vascular disease, unspecified: Secondary | ICD-10-CM | POA: Diagnosis not present

## 2023-09-23 ENCOUNTER — Other Ambulatory Visit: Payer: Self-pay | Admitting: Internal Medicine

## 2023-09-23 DIAGNOSIS — I739 Peripheral vascular disease, unspecified: Secondary | ICD-10-CM

## 2023-09-26 NOTE — Progress Notes (Signed)
Patient notified

## 2023-09-30 ENCOUNTER — Telehealth: Payer: Self-pay | Admitting: Internal Medicine

## 2023-09-30 NOTE — Telephone Encounter (Signed)
Called patient to inform him that based off of his ABI results Dr. Ellsworth Lennox has referred him to vascular surgery. Patient verbalized understanding.

## 2023-10-10 ENCOUNTER — Encounter: Payer: Self-pay | Admitting: Acute Care

## 2023-10-14 ENCOUNTER — Other Ambulatory Visit: Payer: Self-pay | Admitting: Internal Medicine

## 2023-10-14 DIAGNOSIS — G4701 Insomnia due to medical condition: Secondary | ICD-10-CM

## 2023-10-21 ENCOUNTER — Encounter (INDEPENDENT_AMBULATORY_CARE_PROVIDER_SITE_OTHER): Payer: Medicare PPO | Admitting: Nurse Practitioner

## 2023-12-05 ENCOUNTER — Encounter: Payer: Self-pay | Admitting: Internal Medicine

## 2023-12-05 ENCOUNTER — Ambulatory Visit: Payer: Medicare PPO | Admitting: Internal Medicine

## 2023-12-05 VITALS — BP 118/70 | HR 82 | Temp 98.2°F | Ht 71.0 in | Wt 204.0 lb

## 2023-12-05 DIAGNOSIS — I10 Essential (primary) hypertension: Secondary | ICD-10-CM

## 2023-12-05 DIAGNOSIS — E782 Mixed hyperlipidemia: Secondary | ICD-10-CM | POA: Diagnosis not present

## 2023-12-05 DIAGNOSIS — Z0001 Encounter for general adult medical examination with abnormal findings: Secondary | ICD-10-CM

## 2023-12-05 DIAGNOSIS — F17210 Nicotine dependence, cigarettes, uncomplicated: Secondary | ICD-10-CM | POA: Diagnosis not present

## 2023-12-05 DIAGNOSIS — Z1331 Encounter for screening for depression: Secondary | ICD-10-CM

## 2023-12-05 DIAGNOSIS — I739 Peripheral vascular disease, unspecified: Secondary | ICD-10-CM | POA: Diagnosis not present

## 2023-12-05 DIAGNOSIS — N1831 Chronic kidney disease, stage 3a: Secondary | ICD-10-CM | POA: Diagnosis not present

## 2023-12-05 DIAGNOSIS — E119 Type 2 diabetes mellitus without complications: Secondary | ICD-10-CM

## 2023-12-05 DIAGNOSIS — N400 Enlarged prostate without lower urinary tract symptoms: Secondary | ICD-10-CM | POA: Diagnosis not present

## 2023-12-05 DIAGNOSIS — Z720 Tobacco use: Secondary | ICD-10-CM | POA: Diagnosis not present

## 2023-12-05 LAB — POCT CBG (FASTING - GLUCOSE)-MANUAL ENTRY: Glucose Fasting, POC: 156 mg/dL — AB (ref 70–99)

## 2023-12-05 MED ORDER — AMLODIPINE BESYLATE 10 MG PO TABS
10.0000 mg | ORAL_TABLET | Freq: Every morning | ORAL | 1 refills | Status: DC
Start: 2023-12-05 — End: 2024-06-22

## 2023-12-05 MED ORDER — ATORVASTATIN CALCIUM 20 MG PO TABS: 20.0000 mg | ORAL_TABLET | Freq: Every evening | ORAL | 0 refills | Status: DC

## 2023-12-05 MED ORDER — HYDROCHLOROTHIAZIDE 25 MG PO TABS: 25.0000 mg | ORAL_TABLET | Freq: Every morning | ORAL | 0 refills | Status: DC

## 2023-12-05 MED ORDER — CARVEDILOL 12.5 MG PO TABS
12.5000 mg | ORAL_TABLET | Freq: Two times a day (BID) | ORAL | 1 refills | Status: DC
Start: 2023-12-05 — End: 2024-03-12

## 2023-12-05 NOTE — Progress Notes (Signed)
 Established Patient Office Visit  Subjective:  Patient ID: Craig DELAPENA Sr., male    DOB: 1956-05-30  Age: 68 y.o. MRN: [id number]  Chief Complaint  Patient presents with   Annual Exam    3 month follow up  AWV    No new complaints, here for AWV refer to quality metrics and scanned documents.  Failed to have previsit labs done.     No other concerns at this time.   Past Medical History:  Diagnosis Date   HLD (hyperlipidemia)    Hypertension    Myocardial infarction [facility]     Past Surgical History:  Procedure Laterality Date   CORONARY ANGIOPLASTY WITH STENT PLACEMENT     FEMORAL ARTERY STENT      Social History   Socioeconomic History   Marital status: Single    Spouse name: Not on file   Number of children: Not on file   Years of education: Not on file   Highest education level: Not on file  Occupational History   Not on file  Tobacco Use   Smoking status: Every Day    Average packs/day: 2.0 packs/day for 51.1 years (100.2 ttl pk-yrs)    Types: Cigarettes    Start date: 10/1972    Last attempt to quit: 10/2022    Years since quitting: 1.1   Smokeless tobacco: Never  Vaping Use   Vaping status: Never Used  Substance and Sexual Activity   Alcohol use: Not Currently   Drug use: Not Currently   Sexual activity: Not Currently  Other Topics Concern   Not on file  Social History Narrative   Not on file   Social Drivers of Health   Financial Resource Strain: Not on file  Food Insecurity: Food Insecurity Present (07/23/2019)   Received from [facility], [facility]   Hunger Vital Sign    Worried About Running Out of Food in the Last Year: Sometimes true    Ran Out of Food in the Last Year: Sometimes true  Transportation Needs: No Transportation Needs (05/26/2022)   PRAPARE - Administrator, Civil Service (Medical): No    Lack of Transportation (Non-Medical): No  Physical Activity: Insufficiently Active (07/23/2019)   Received  from [facility], [facility]   Exercise Vital Sign    Days of Exercise per Week: 5 days    Minutes of Exercise per Session: 10 min  Stress: Not on file  Social Connections: Not on file  Intimate Partner Violence: Not on file    No family history on file.  No Known Allergies  Outpatient Medications Prior to Visit  Medication Sig   aspirin EC 81 MG tablet Take 81 mg by mouth daily. Swallow whole.   cefadroxil (DURICEF) 500 MG capsule Take 500 mg by mouth 2 (two) times daily.   escitalopram (LEXAPRO) 10 MG tablet Take 1 tablet (10 mg total) by mouth every morning.   metFORMIN (GLUCOPHAGE-XR) 500 MG 24 hr tablet TAKE 1 TABLET BY MOUTH EVERY DAY WITH BREAKFAST   nitroGLYCERIN (NITROSTAT) 0.4 MG SL tablet Place 0.4 mg under the tongue every 5 (five) minutes as needed for chest pain. (Patient not taking: Reported on 08/16/2023)   sildenafil (REVATIO) 20 MG tablet Take 20-100 mg by mouth as needed (for sex).   tamsulosin (FLOMAX) 0.4 MG CAPS capsule Take 1 capsule (0.4 mg total) by mouth every morning.   traZODone (DESYREL) 50 MG tablet TAKE 1 TO 2 TABLETS(50 TO  100 MG) BY MOUTH AT BEDTIME AS NEEDED FOR SLEEP   [DISCONTINUED] amLODipine (NORVASC) 10 MG tablet TAKE 1 TABLET BY MOUTH EVERY MORNING   [DISCONTINUED] atorvastatin (LIPITOR) 20 MG tablet Take 1 tablet (20 mg total) by mouth every evening.   [DISCONTINUED] carvedilol (COREG) 12.5 MG tablet TAKE 1 TABLET BY MOUTH TWICE DAILY   [DISCONTINUED] hydrochlorothiazide (HYDRODIURIL) 25 MG tablet TAKE 1 TABLET BY MOUTH EVERY MORNING   No facility-administered medications prior to visit.    Review of Systems  Constitutional: Negative.   HENT: Negative.    Eyes: Negative.   Respiratory: Negative.    Cardiovascular: Negative.   Gastrointestinal: Negative.   Genitourinary: Negative.   Skin: Negative.   Neurological: Negative.   Endo/Heme/Allergies: Negative.        Objective:   BP 118/70   Pulse 82   Temp 98.2 F (36.8  C)   Ht 5\' 11"  (1.803 m)   Wt 204 lb (92.5 kg)   SpO2 98%   BMI 28.45 kg/m   Vitals:   12/05/23 1027  BP: 118/70  Pulse: 82  Temp: 98.2 F (36.8 C)  Height: 5\' 11"  (1.803 m)  Weight: 204 lb (92.5 kg)  SpO2: 98%  BMI (Calculated): 28.46    Physical Exam Vitals reviewed.  Constitutional:      Appearance: Normal appearance.  HENT:     Head: Normocephalic.     Left Ear: There is no impacted cerumen.     Nose: Nose normal.     Mouth/Throat:     Mouth: Mucous membranes are moist.     Pharynx: No posterior oropharyngeal erythema.  Eyes:     Extraocular Movements: Extraocular movements intact.     Pupils: Pupils are equal, round, and reactive to light.  Cardiovascular:     Rate and Rhythm: Regular rhythm.     Chest Wall: PMI is not displaced.     Pulses: Normal pulses.     Heart sounds: Normal heart sounds. No murmur heard. Pulmonary:     Effort: Pulmonary effort is normal.     Breath sounds: Normal air entry. No rhonchi or rales.  Abdominal:     General: Abdomen is flat. Bowel sounds are normal. There is no distension.     Palpations: Abdomen is soft. There is no hepatomegaly, splenomegaly or mass.     Tenderness: There is no abdominal tenderness. There is no right CVA tenderness or left CVA tenderness.  Musculoskeletal:        General: Normal range of motion.     Cervical back: Normal range of motion and neck supple.     Right lower leg: No edema.     Left lower leg: No edema.  Skin:    General: Skin is warm and dry.  Neurological:     General: No focal deficit present.     Mental Status: He is alert and oriented to person, place, and time.     Cranial Nerves: No cranial nerve deficit.     Motor: No weakness.  Psychiatric:        Mood and Affect: Mood normal.        Behavior: Behavior normal.      Results for orders placed or performed in visit on 12/05/23  POCT CBG (Fasting - Glucose)  Result Value Ref Range   Glucose Fasting, POC 156 (A) 70 - 99 mg/dL     Recent Results (from the past 2160 hours)  POCT CBG (Fasting - Glucose)  Status: None   Collection Time: 09/06/23 11:52 AM  Result Value Ref Range   Glucose Fasting, POC 94 70 - 99 mg/dL  POCT CBG (Fasting - Glucose)     Status: Abnormal   Collection Time: 12/05/23 10:34 AM  Result Value Ref Range   Glucose Fasting, POC 156 (A) 70 - 99 mg/dL      Assessment & Plan:  As per problem list.  Problem List Items Addressed This Visit       Cardiovascular and Mediastinum   Essential hypertension   Relevant Medications   amLODipine (NORVASC) 10 MG tablet   atorvastatin (LIPITOR) 20 MG tablet   carvedilol (COREG) 12.5 MG tablet   hydrochlorothiazide (HYDRODIURIL) 25 MG tablet   PAD (peripheral artery disease) (HCC)   Relevant Medications   amLODipine (NORVASC) 10 MG tablet   atorvastatin (LIPITOR) 20 MG tablet   carvedilol (COREG) 12.5 MG tablet   hydrochlorothiazide (HYDRODIURIL) 25 MG tablet     Endocrine   Type 2 diabetes mellitus without complication, without long-term current use of insulin (HCC) - Primary   Relevant Medications   atorvastatin (LIPITOR) 20 MG tablet   Other Relevant Orders   POCT CBG (Fasting - Glucose) (Completed)   Hemoglobin A1c   Hemoglobin A1c     Genitourinary   CKD stage 3a, GFR 45-59 ml/min (HCC)   Relevant Orders   Comprehensive metabolic panel   CBC With Diff/Platelet     Other   Mixed hyperlipidemia   Relevant Medications   amLODipine (NORVASC) 10 MG tablet   atorvastatin (LIPITOR) 20 MG tablet   carvedilol (COREG) 12.5 MG tablet   hydrochlorothiazide (HYDRODIURIL) 25 MG tablet   Other Relevant Orders   Comprehensive metabolic panel   Lipid panel   Lipid panel   Other Visit Diagnoses       Benign prostatic hyperplasia without lower urinary tract symptoms       Relevant Orders   PSA     Tobacco abuse       Relevant Orders   CT CHEST LUNG CA SCREEN LOW DOSE W/O CM       Return in about 10 days (around 12/15/2023) for  lab results.   Total time spent: 40 minutes  Luna Fuse, MD  12/05/2023   This document may have been prepared by [facility] Voice Recognition software and as such may include unintentional dictation errors.

## 2023-12-06 LAB — COMPREHENSIVE METABOLIC PANEL
ALT: 22 IU/L (ref 0–44)
AST: 17 IU/L (ref 0–40)
Albumin: 4.4 g/dL (ref 3.9–4.9)
Alkaline Phosphatase: 120 IU/L (ref 44–121)
BUN/Creatinine Ratio: 15 (ref 10–24)
BUN: 18 mg/dL (ref 8–27)
Bilirubin Total: 0.7 mg/dL (ref 0.0–1.2)
CO2: 22 mmol/L (ref 20–29)
Calcium: 9.4 mg/dL (ref 8.6–10.2)
Chloride: 102 mmol/L (ref 96–106)
Creatinine, Ser: 1.22 mg/dL (ref 0.76–1.27)
Globulin, Total: 2.7 g/dL (ref 1.5–4.5)
Glucose: 93 mg/dL (ref 70–99)
Potassium: 4.2 mmol/L (ref 3.5–5.2)
Sodium: 138 mmol/L (ref 134–144)
Total Protein: 7.1 g/dL (ref 6.0–8.5)
eGFR: 65 mL/min/{1.73_m2} (ref >59)

## 2023-12-06 LAB — CBC WITH DIFF/PLATELET
Basophils Absolute: 0 10*3/uL (ref 0.0–0.2)
Basos: 1 %
EOS (ABSOLUTE): 0 10*3/uL (ref 0.0–0.4)
Eos: 1 %
Hematocrit: 51.6 % — ABNORMAL HIGH (ref 37.5–51.0)
Hemoglobin: 17.4 g/dL (ref 13.0–17.7)
Immature Grans (Abs): 0 10*3/uL (ref 0.0–0.1)
Immature Granulocytes: 0 %
Lymphocytes Absolute: 2.2 10*3/uL (ref 0.7–3.1)
Lymphs: 31 %
MCH: 28.5 pg (ref 26.6–33.0)
MCHC: 33.7 g/dL (ref 31.5–35.7)
MCV: 85 fL (ref 79–97)
Monocytes Absolute: 0.7 10*3/uL (ref 0.1–0.9)
Monocytes: 10 %
Neutrophils Absolute: 4.1 10*3/uL (ref 1.4–7.0)
Neutrophils: 57 %
Platelets: 214 10*3/uL (ref 150–450)
RBC: 6.1 x10E6/uL — ABNORMAL HIGH (ref 4.14–5.80)
RDW: 13.4 % (ref 11.6–15.4)
WBC: 7.2 10*3/uL (ref 3.4–10.8)

## 2023-12-06 LAB — LIPID PANEL
Chol/HDL Ratio: 5.1 ratio — ABNORMAL HIGH (ref 0.0–5.0)
Cholesterol, Total: 138 mg/dL (ref 100–199)
HDL: 27 mg/dL — ABNORMAL LOW (ref >39)
LDL Chol Calc (NIH): 73 mg/dL (ref 0–99)
Triglycerides: 226 mg/dL — ABNORMAL HIGH (ref 0–149)
VLDL Cholesterol Cal: 38 mg/dL (ref 5–40)

## 2023-12-06 LAB — PSA: Prostate Specific Ag, Serum: 0.3 ng/mL (ref 0.0–4.0)

## 2023-12-06 LAB — HEMOGLOBIN A1C
Est. average glucose Bld gHb Est-mCnc: 140 mg/dL
Hgb A1c MFr Bld: 6.5 % — ABNORMAL HIGH (ref 4.8–5.6)

## 2023-12-11 ENCOUNTER — Other Ambulatory Visit: Payer: Self-pay | Admitting: Internal Medicine

## 2023-12-11 DIAGNOSIS — I10 Essential (primary) hypertension: Secondary | ICD-10-CM

## 2023-12-17 ENCOUNTER — Other Ambulatory Visit: Payer: Self-pay | Admitting: Internal Medicine

## 2023-12-17 DIAGNOSIS — I10 Essential (primary) hypertension: Secondary | ICD-10-CM

## 2023-12-20 ENCOUNTER — Encounter: Payer: Self-pay | Admitting: Internal Medicine

## 2023-12-20 ENCOUNTER — Ambulatory Visit (INDEPENDENT_AMBULATORY_CARE_PROVIDER_SITE_OTHER): Admitting: Internal Medicine

## 2023-12-20 ENCOUNTER — Other Ambulatory Visit: Payer: Self-pay

## 2023-12-20 VITALS — BP 120/81 | HR 70 | Temp 98.9°F | Ht 71.0 in | Wt 207.0 lb

## 2023-12-20 DIAGNOSIS — E119 Type 2 diabetes mellitus without complications: Secondary | ICD-10-CM

## 2023-12-20 DIAGNOSIS — G4701 Insomnia due to medical condition: Secondary | ICD-10-CM

## 2023-12-20 DIAGNOSIS — Z0130 Encounter for examination of blood pressure without abnormal findings: Secondary | ICD-10-CM

## 2023-12-20 LAB — POCT CBG (FASTING - GLUCOSE)-MANUAL ENTRY: Glucose Fasting, POC: 92 mg/dL (ref 70–99)

## 2023-12-20 MED ORDER — TRAZODONE HCL 50 MG PO TABS: 50.0000 mg | ORAL_TABLET | Freq: Every evening | ORAL | 0 refills | Status: DC | PRN

## 2023-12-20 MED ORDER — ONETOUCH DELICA LANCING DEV MISC: 1.0000 | Freq: Once | 0 refills | Status: AC

## 2023-12-20 MED ORDER — ONETOUCH ULTRA TEST VI STRP: ORAL_STRIP | 12 refills | Status: DC

## 2023-12-20 MED ORDER — METFORMIN HCL ER 500 MG PO TB24: 500.0000 mg | ORAL_TABLET | Freq: Every day | ORAL | 1 refills | Status: DC

## 2023-12-20 MED ORDER — ONETOUCH ULTRASOFT 2 LANCETS MISC: 1.0000 | Freq: Two times a day (BID) | 3 refills | Status: AC

## 2023-12-20 MED ORDER — ONETOUCH ULTRA TEST VI STRP: ORAL_STRIP | 12 refills | Status: AC

## 2023-12-20 MED ORDER — ONETOUCH ULTRA 2 W/DEVICE KIT: 1.0000 | PACK | Freq: Once | 0 refills | Status: AC

## 2023-12-20 NOTE — Progress Notes (Signed)
 Established Patient Office Visit  Subjective:  Patient ID: Craig OKANE Sr., male    DOB: 01-13-1956  Age: 68 y.o. MRN: [id number]  No chief complaint on file.   No new complaints, here for lab review and medication refills. No new complaints, here for lab review and medication refills. Labs reviewed and notable for well controlled diabetes, A1c at target, lipids at target with unremarkable cmp although triglycerides are elevated. Denies any hypoglycemic episodes and home bg readings have been at target.      No other concerns at this time.   Past Medical History:  Diagnosis Date   HLD (hyperlipidemia)    Hypertension    Myocardial infarction [facility])     Past Surgical History:  Procedure Laterality Date   CORONARY ANGIOPLASTY WITH STENT PLACEMENT     FEMORAL ARTERY STENT      Social History   Socioeconomic History   Marital status: Single    Spouse name: Not on file   Number of children: Not on file   Years of education: Not on file   Highest education level: Not on file  Occupational History   Not on file  Tobacco Use   Smoking status: Every Day    Average packs/day: 2.0 packs/day for 51.1 years (100.2 ttl pk-yrs)    Types: Cigarettes    Start date: 10/1972    Last attempt to quit: 10/2022    Years since quitting: 1.2   Smokeless tobacco: Never  Vaping Use   Vaping status: Never Used  Substance and Sexual Activity   Alcohol use: Not Currently   Drug use: Not Currently   Sexual activity: Not Currently  Other Topics Concern   Not on file  Social History Narrative   Not on file   Social Drivers of Health   Financial Resource Strain: Not on file  Food Insecurity: Food Insecurity Present (07/23/2019)   Received from [facility], [facility]   Hunger Vital Sign    Worried About Running Out of Food in the Last Year: Sometimes true    Ran Out of Food in the Last Year: Sometimes true  Transportation Needs: No Transportation Needs (05/26/2022)    PRAPARE - Administrator, Civil Service (Medical): No    Lack of Transportation (Non-Medical): No  Physical Activity: Insufficiently Active (07/23/2019)   Received from [facility], [facility]   Exercise Vital Sign    Days of Exercise per Week: 5 days    Minutes of Exercise per Session: 10 min  Stress: Not on file  Social Connections: Not on file  Intimate Partner Violence: Not on file    No family history on file.  No Known Allergies  Outpatient Medications Prior to Visit  Medication Sig   amLODipine (NORVASC) 10 MG tablet Take 1 tablet (10 mg total) by mouth every morning.   aspirin EC 81 MG tablet Take 81 mg by mouth daily. Swallow whole.   atorvastatin (LIPITOR) 20 MG tablet Take 1 tablet (20 mg total) by mouth every evening.   carvedilol (COREG) 12.5 MG tablet Take 1 tablet (12.5 mg total) by mouth 2 (two) times daily.   cefadroxil (DURICEF) 500 MG capsule Take 500 mg by mouth 2 (two) times daily.   escitalopram (LEXAPRO) 10 MG tablet Take 1 tablet (10 mg total) by mouth every morning.   hydrochlorothiazide (HYDRODIURIL) 25 MG tablet Take 1 tablet (25 mg total) by mouth every morning.   nitroGLYCERIN (NITROSTAT) 0.4  MG SL tablet Place 0.4 mg under the tongue every 5 (five) minutes as needed for chest pain. (Patient not taking: Reported on 08/16/2023)   sildenafil (REVATIO) 20 MG tablet Take 20-100 mg by mouth as needed (for sex).   tamsulosin (FLOMAX) 0.4 MG CAPS capsule Take 1 capsule (0.4 mg total) by mouth every morning.   [DISCONTINUED] metFORMIN (GLUCOPHAGE-XR) 500 MG 24 hr tablet TAKE 1 TABLET BY MOUTH EVERY DAY WITH BREAKFAST   [DISCONTINUED] traZODone (DESYREL) 50 MG tablet TAKE 1 TO 2 TABLETS(50 TO 100 MG) BY MOUTH AT BEDTIME AS NEEDED FOR SLEEP   No facility-administered medications prior to visit.    Review of Systems  Constitutional: Negative.   HENT: Negative.    Eyes: Negative.   Respiratory: Negative.    Cardiovascular: Negative.    Gastrointestinal: Negative.   Genitourinary: Negative.   Skin: Negative.   Neurological: Negative.   Endo/Heme/Allergies: Negative.        Objective:   BP 120/81   Pulse 70   Temp 98.9 F (37.2 C)   Ht 5\' 11"  (1.803 m)   Wt 207 lb (93.9 kg)   SpO2 98%   BMI 28.87 kg/m   Vitals:   12/20/23 1102  BP: 120/81  Pulse: 70  Temp: 98.9 F (37.2 C)  Height: 5\' 11"  (1.803 m)  Weight: 207 lb (93.9 kg)  SpO2: 98%  BMI (Calculated): 28.88    Physical Exam Vitals reviewed.  Constitutional:      Appearance: Normal appearance.  HENT:     Head: Normocephalic.     Left Ear: There is no impacted cerumen.     Nose: Nose normal.     Mouth/Throat:     Mouth: Mucous membranes are moist.     Pharynx: No posterior oropharyngeal erythema.  Eyes:     Extraocular Movements: Extraocular movements intact.     Pupils: Pupils are equal, round, and reactive to light.  Cardiovascular:     Rate and Rhythm: Regular rhythm.     Chest Wall: PMI is not displaced.     Pulses: Normal pulses.     Heart sounds: Normal heart sounds. No murmur heard. Pulmonary:     Effort: Pulmonary effort is normal.     Breath sounds: Normal air entry. No rhonchi or rales.  Abdominal:     General: Abdomen is flat. Bowel sounds are normal. There is no distension.     Palpations: Abdomen is soft. There is no hepatomegaly, splenomegaly or mass.     Tenderness: There is no abdominal tenderness. There is no right CVA tenderness or left CVA tenderness.  Musculoskeletal:        General: Normal range of motion.     Cervical back: Normal range of motion and neck supple.     Right lower leg: No edema.     Left lower leg: No edema.  Skin:    General: Skin is warm and dry.  Neurological:     General: No focal deficit present.     Mental Status: He is alert and oriented to person, place, and time.     Cranial Nerves: No cranial nerve deficit.     Motor: No weakness.  Psychiatric:        Mood and Affect: Mood normal.         Behavior: Behavior normal.      Results for orders placed or performed in visit on 12/20/23  POCT CBG (Fasting - Glucose)  Result Value Ref Range  Glucose Fasting, POC 92 70 - 99 mg/dL    Recent Results (from the past 2160 hours)  POCT CBG (Fasting - Glucose)     Status: Abnormal   Collection Time: 12/05/23 10:34 AM  Result Value Ref Range   Glucose Fasting, POC 156 (A) 70 - 99 mg/dL  Comprehensive metabolic panel     Status: None   Collection Time: 12/05/23 11:31 AM  Result Value Ref Range   Glucose 93 70 - 99 mg/dL   BUN 18 8 - 27 mg/dL   Creatinine, Ser 2.13 0.76 - 1.27 mg/dL   eGFR 65 >08 MV/HQI/6.96   BUN/Creatinine Ratio 15 10 - 24   Sodium 138 134 - 144 mmol/L   Potassium 4.2 3.5 - 5.2 mmol/L   Chloride 102 96 - 106 mmol/L   CO2 22 20 - 29 mmol/L   Calcium 9.4 8.6 - 10.2 mg/dL   Total Protein 7.1 6.0 - 8.5 g/dL   Albumin 4.4 3.9 - 4.9 g/dL   Globulin, Total 2.7 1.5 - 4.5 g/dL   Bilirubin Total 0.7 0.0 - 1.2 mg/dL   Alkaline Phosphatase 120 44 - 121 IU/L   AST 17 0 - 40 IU/L   ALT 22 0 - 44 IU/L  Hemoglobin A1c     Status: Abnormal   Collection Time: 12/05/23 11:31 AM  Result Value Ref Range   Hgb A1c MFr Bld 6.5 (H) 4.8 - 5.6 %    Comment:          Prediabetes: 5.7 - 6.4          Diabetes: >6.4          Glycemic control for adults with diabetes: <7.0    Est. average glucose Bld gHb Est-mCnc 140 mg/dL  CBC With Diff/Platelet     Status: Abnormal   Collection Time: 12/05/23 11:31 AM  Result Value Ref Range   WBC 7.2 3.4 - 10.8 x10E3/uL   RBC 6.10 (H) 4.14 - 5.80 x10E6/uL   Hemoglobin 17.4 13.0 - 17.7 g/dL   Hematocrit 29.5 (H) 28.4 - 51.0 %   MCV 85 79 - 97 fL   MCH 28.5 26.6 - 33.0 pg   MCHC 33.7 31.5 - 35.7 g/dL   RDW 13.2 44.0 - 10.2 %   Platelets 214 150 - 450 x10E3/uL   Neutrophils 57 Not Estab. %   Lymphs 31 Not Estab. %   Monocytes 10 Not Estab. %   Eos 1 Not Estab. %   Basos 1 Not Estab. %   Neutrophils Absolute 4.1 1.4 - 7.0  x10E3/uL   Lymphocytes Absolute 2.2 0.7 - 3.1 x10E3/uL   Monocytes Absolute 0.7 0.1 - 0.9 x10E3/uL   EOS (ABSOLUTE) 0.0 0.0 - 0.4 x10E3/uL   Basophils Absolute 0.0 0.0 - 0.2 x10E3/uL   Immature Granulocytes 0 Not Estab. %   Immature Grans (Abs) 0.0 0.0 - 0.1 x10E3/uL  PSA     Status: None   Collection Time: 12/05/23 11:31 AM  Result Value Ref Range   Prostate Specific Ag, Serum 0.3 0.0 - 4.0 ng/mL    Comment: Roche ECLIA methodology. According to the American Urological Association, Serum PSA should decrease and remain at undetectable levels after radical prostatectomy. The AUA defines biochemical recurrence as an initial PSA value 0.2 ng/mL or greater followed by a subsequent confirmatory PSA value 0.2 ng/mL or greater. Values obtained with different assay methods or kits cannot be used interchangeably. Results cannot be interpreted as absolute evidence of the presence  or absence of malignant disease.   Lipid panel     Status: Abnormal   Collection Time: 12/05/23 11:31 AM  Result Value Ref Range   Cholesterol, Total 138 100 - 199 mg/dL   Triglycerides 161 (H) 0 - 149 mg/dL   HDL 27 (L) >09 mg/dL   VLDL Cholesterol Cal 38 5 - 40 mg/dL   LDL Chol Calc (NIH) 73 0 - 99 mg/dL   Chol/HDL Ratio 5.1 (H) 0.0 - 5.0 ratio    Comment:                                   T. Chol/HDL Ratio                                             Men  Women                               1/2 Avg.Risk  3.4    3.3                                   Avg.Risk  5.0    4.4                                2X Avg.Risk  9.6    7.1                                3X Avg.Risk 23.4   11.0   POCT CBG (Fasting - Glucose)     Status: None   Collection Time: 12/20/23 11:06 AM  Result Value Ref Range   Glucose Fasting, POC 92 70 - 99 mg/dL      Assessment & Plan:  As per problem list  Problem List Items Addressed This Visit       Endocrine   Type 2 diabetes mellitus without complication, without long-term current  use of insulin (HCC) - Primary   Relevant Medications   Blood Glucose Monitoring Suppl (ONE TOUCH ULTRA 2) w/Device KIT   glucose blood (ONETOUCH ULTRA TEST) test strip   Lancet Devices (ONE TOUCH DELICA LANCING DEV) MISC   OneTouch UltraSoft 2 Lancets MISC   metFORMIN (GLUCOPHAGE-XR) 500 MG 24 hr tablet   Other Relevant Orders   POCT CBG (Fasting - Glucose) (Completed)   Other Visit Diagnoses       Insomnia due to medical condition       Relevant Medications   traZODone (DESYREL) 50 MG tablet       Return in about 3 months (around 03/21/2024) for fu with labs prior.   Total time spent: 20 minutes  Luna Fuse, MD  12/20/2023   This document may have been prepared by [facility] Voice Recognition software and as such may include unintentional dictation errors.

## 2024-01-03 ENCOUNTER — Other Ambulatory Visit

## 2024-01-22 ENCOUNTER — Other Ambulatory Visit: Payer: Self-pay | Admitting: Internal Medicine

## 2024-01-22 DIAGNOSIS — E119 Type 2 diabetes mellitus without complications: Secondary | ICD-10-CM

## 2024-02-13 ENCOUNTER — Ambulatory Visit: Admission: RE | Admit: 2024-02-13 | Source: Ambulatory Visit

## 2024-02-15 ENCOUNTER — Other Ambulatory Visit: Payer: Self-pay | Admitting: Internal Medicine

## 2024-02-15 DIAGNOSIS — F3289 Other specified depressive episodes: Secondary | ICD-10-CM

## 2024-02-16 ENCOUNTER — Other Ambulatory Visit: Payer: Self-pay | Admitting: Internal Medicine

## 2024-02-16 DIAGNOSIS — I10 Essential (primary) hypertension: Secondary | ICD-10-CM

## 2024-03-12 ENCOUNTER — Other Ambulatory Visit: Payer: Self-pay | Admitting: Internal Medicine

## 2024-03-12 DIAGNOSIS — I10 Essential (primary) hypertension: Secondary | ICD-10-CM

## 2024-03-28 ENCOUNTER — Ambulatory Visit (INDEPENDENT_AMBULATORY_CARE_PROVIDER_SITE_OTHER): Admitting: Internal Medicine

## 2024-03-28 ENCOUNTER — Encounter: Payer: Self-pay | Admitting: Internal Medicine

## 2024-03-28 VITALS — BP 115/70 | HR 64 | Temp 96.1°F | Ht 71.0 in | Wt 204.0 lb

## 2024-03-28 DIAGNOSIS — N401 Enlarged prostate with lower urinary tract symptoms: Secondary | ICD-10-CM

## 2024-03-28 DIAGNOSIS — E119 Type 2 diabetes mellitus without complications: Secondary | ICD-10-CM

## 2024-03-28 DIAGNOSIS — Z0130 Encounter for examination of blood pressure without abnormal findings: Secondary | ICD-10-CM

## 2024-03-28 DIAGNOSIS — R351 Nocturia: Secondary | ICD-10-CM

## 2024-03-28 LAB — GLUCOSE, POCT (MANUAL RESULT ENTRY): POC Glucose: 100 mg/dL — AB (ref 70–99)

## 2024-03-28 MED ORDER — TAMSULOSIN HCL 0.4 MG PO CAPS: 0.4000 mg | ORAL_CAPSULE | Freq: Every morning | ORAL | 1 refills | Status: DC

## 2024-03-28 NOTE — Progress Notes (Signed)
 Established Patient Office Visit  Subjective:  Patient ID: Craig CROSSLAND Sr., male    DOB: 13-Aug-1956  Age: 68 y.o. MRN: [id number]  Chief Complaint  Patient presents with   Follow-up    3 month lab results    No new complaints, here for lab review and medication refills. Failed to have previsit labs done.      No other concerns at this time.   Past Medical History:  Diagnosis Date   HLD (hyperlipidemia)    Hypertension    Myocardial infarction [facility])     Past Surgical History:  Procedure Laterality Date   CORONARY ANGIOPLASTY WITH STENT PLACEMENT     FEMORAL ARTERY STENT      Social History   Socioeconomic History   Marital status: Single    Spouse name: Not on file   Number of children: Not on file   Years of education: Not on file   Highest education level: Not on file  Occupational History   Not on file  Tobacco Use   Smoking status: Every Day    Average packs/day: 2.0 packs/day for 51.1 years (100.2 ttl pk-yrs)    Types: Cigarettes    Start date: 10/1972    Last attempt to quit: 10/2022    Years since quitting: 1.4   Smokeless tobacco: Never  Vaping Use   Vaping status: Never Used  Substance and Sexual Activity   Alcohol use: Not Currently   Drug use: Not Currently   Sexual activity: Not Currently  Other Topics Concern   Not on file  Social History Narrative   Not on file   Social Drivers of Health   Financial Resource Strain: Not on file  Food Insecurity: Food Insecurity Present (07/23/2019)   [facility]   Hunger Vital Sign    Within the past 12 months, you worried that your food would run out before you got the money to buy more.: Sometimes true    Within the past 12 months, the food you bought just didn't last and you didn't have money to get more.: Sometimes true  Transportation Needs: No Transportation Needs (05/26/2022)   PRAPARE - Administrator, Civil Service (Medical): No    Lack of Transportation  (Non-Medical): No  Physical Activity: Insufficiently Active (07/23/2019)   [facility] from [facility]   Exercise Vital Sign    On average, how many days per week do you engage in moderate to strenuous exercise (like a brisk walk)?: 5 days    On average, how many minutes do you engage in exercise at this level?: 10 min  Stress: Not on file  Social Connections: Not on file  Intimate Partner Violence: Not on file    No family history on file.  No Known Allergies  Outpatient Medications Prior to Visit  Medication Sig   amLODipine  (NORVASC ) 10 MG tablet Take 1 tablet (10 mg total) by mouth every morning.   aspirin  EC 81 MG tablet Take 81 mg by mouth daily. Swallow whole.   atorvastatin  (LIPITOR) 20 MG tablet Take 1 tablet (20 mg total) by mouth every evening.   carvedilol  (COREG ) 12.5 MG tablet TAKE 1 TABLET(12.5 MG) BY MOUTH TWICE DAILY   escitalopram  (LEXAPRO ) 10 MG tablet TAKE 1 TABLET(10 MG) BY MOUTH EVERY MORNING   glucose blood (ONETOUCH ULTRA TEST) test strip Use  up to three time daily to check sugars   hydrochlorothiazide  (HYDRODIURIL ) 25 MG tablet TAKE 1 TABLET(25 MG)  BY MOUTH EVERY MORNING   metFORMIN  (GLUCOPHAGE -XR) 500 MG 24 hr tablet TAKE 1 TABLET BY MOUTH EVERY DAY WITH BREAKFAST   nitroGLYCERIN (NITROSTAT) 0.4 MG SL tablet Place 0.4 mg under the tongue every 5 (five) minutes as needed for chest pain.   OneTouch UltraSoft 2 Lancets MISC 1 Lancet by Does not apply route 2 (two) times daily.   sildenafil (REVATIO) 20 MG tablet Take 20-100 mg by mouth as needed (for sex).   traZODone  (DESYREL ) 50 MG tablet Take 1 tablet (50 mg total) by mouth at bedtime as needed for sleep.   [DISCONTINUED] cefadroxil (DURICEF) 500 MG capsule Take 500 mg by mouth 2 (two) times daily.   [DISCONTINUED] tamsulosin  (FLOMAX ) 0.4 MG CAPS capsule Take 1 capsule (0.4 mg total) by mouth every morning.   No facility-administered medications prior to visit.    Review of Systems  Constitutional:   Positive for weight loss (3 lbs).  HENT: Negative.    Eyes: Negative.   Respiratory: Negative.    Cardiovascular: Negative.   Gastrointestinal: Negative.   Genitourinary: Negative.   Skin: Negative.   Neurological: Negative.   Endo/Heme/Allergies: Negative.        Objective:   BP 115/70   Pulse 64   Temp (!) 96.1 F (35.6 C)   Ht 5' 11 (1.803 m)   Wt 204 lb (92.5 kg)   SpO2 94%   BMI 28.45 kg/m   Vitals:   03/28/24 0941  BP: 115/70  Pulse: 64  Temp: (!) 96.1 F (35.6 C)  Height: 5' 11 (1.803 m)  Weight: 204 lb (92.5 kg)  SpO2: 94%  BMI (Calculated): 28.46    Physical Exam Vitals reviewed.  Constitutional:      Appearance: Normal appearance.  HENT:     Head: Normocephalic.     Left Ear: There is no impacted cerumen.     Nose: Nose normal.     Mouth/Throat:     Mouth: Mucous membranes are moist.     Pharynx: No posterior oropharyngeal erythema.   Eyes:     Extraocular Movements: Extraocular movements intact.     Pupils: Pupils are equal, round, and reactive to light.    Cardiovascular:     Rate and Rhythm: Regular rhythm.     Chest Wall: PMI is not displaced.     Pulses: Normal pulses.     Heart sounds: Normal heart sounds. No murmur heard. Pulmonary:     Effort: Pulmonary effort is normal.     Breath sounds: Normal air entry. No rhonchi or rales.  Abdominal:     General: Abdomen is flat. Bowel sounds are normal. There is no distension.     Palpations: Abdomen is soft. There is no hepatomegaly, splenomegaly or mass.     Tenderness: There is no abdominal tenderness. There is no right CVA tenderness or left CVA tenderness.   Musculoskeletal:        General: Normal range of motion.     Cervical back: Normal range of motion and neck supple.     Right lower leg: No edema.     Left lower leg: No edema.   Skin:    General: Skin is warm and dry.   Neurological:     General: No focal deficit present.     Mental Status: He is alert and oriented to  person, place, and time.     Cranial Nerves: No cranial nerve deficit.     Motor: No weakness.   Psychiatric:  Mood and Affect: Mood normal.        Behavior: Behavior normal.      Results for orders placed or performed in visit on 03/28/24  POCT Glucose (CBG)  Result Value Ref Range   POC Glucose 100 (A) 70 - 99 mg/dl    Recent Results (from the past 2160 hours)  POCT Glucose (CBG)     Status: Abnormal   Collection Time: 03/28/24  9:46 AM  Result Value Ref Range   POC Glucose 100 (A) 70 - 99 mg/dl      Assessment & Plan:  As per problem list. Problem List Items Addressed This Visit       Endocrine   Type 2 diabetes mellitus without complication, without long-term current use of insulin (HCC) - Primary   Relevant Orders   POCT Glucose (CBG) (Completed)   Other Visit Diagnoses       BPH associated with nocturia       Relevant Medications   tamsulosin  (FLOMAX ) 0.4 MG CAPS capsule       Return in about 2 weeks (around 04/11/2024) for fu with labs prior.   Total time spent: 20 minutes  Sherrill Cinderella Perry, MD  03/28/2024   This document may have been prepared by [facility] Voice Recognition software and as such may include unintentional dictation errors.

## 2024-04-09 ENCOUNTER — Other Ambulatory Visit

## 2024-04-09 DIAGNOSIS — E782 Mixed hyperlipidemia: Secondary | ICD-10-CM

## 2024-04-09 DIAGNOSIS — E119 Type 2 diabetes mellitus without complications: Secondary | ICD-10-CM | POA: Diagnosis not present

## 2024-04-10 LAB — LIPID PANEL
Chol/HDL Ratio: 5.1 ratio — ABNORMAL HIGH (ref 0.0–5.0)
Cholesterol, Total: 122 mg/dL (ref 100–199)
HDL: 24 mg/dL — ABNORMAL LOW (ref >39)
LDL Chol Calc (NIH): 64 mg/dL (ref 0–99)
Triglycerides: 206 mg/dL — ABNORMAL HIGH (ref 0–149)
VLDL Cholesterol Cal: 34 mg/dL (ref 5–40)

## 2024-04-10 LAB — HEMOGLOBIN A1C
Est. average glucose Bld gHb Est-mCnc: 140 mg/dL
Hgb A1c MFr Bld: 6.5 % — ABNORMAL HIGH (ref 4.8–5.6)

## 2024-04-11 ENCOUNTER — Ambulatory Visit: Admitting: Internal Medicine

## 2024-04-17 ENCOUNTER — Encounter: Payer: Self-pay | Admitting: Internal Medicine

## 2024-04-17 ENCOUNTER — Ambulatory Visit (INDEPENDENT_AMBULATORY_CARE_PROVIDER_SITE_OTHER): Admitting: Internal Medicine

## 2024-04-17 VITALS — BP 110/64 | HR 68 | Temp 97.5°F | Ht 71.0 in | Wt 204.6 lb

## 2024-04-17 DIAGNOSIS — E782 Mixed hyperlipidemia: Secondary | ICD-10-CM

## 2024-04-17 DIAGNOSIS — E119 Type 2 diabetes mellitus without complications: Secondary | ICD-10-CM | POA: Diagnosis not present

## 2024-04-17 DIAGNOSIS — Z0130 Encounter for examination of blood pressure without abnormal findings: Secondary | ICD-10-CM

## 2024-04-17 LAB — POCT CBG (FASTING - GLUCOSE)-MANUAL ENTRY: Glucose Fasting, POC: 93 mg/dL (ref 70–99)

## 2024-04-17 MED ORDER — ATORVASTATIN CALCIUM 20 MG PO TABS: 20.0000 mg | ORAL_TABLET | Freq: Every evening | ORAL | 0 refills | Status: AC

## 2024-04-17 NOTE — Progress Notes (Signed)
 Established Patient Office Visit  Subjective:  Patient ID: Craig ALTHAUS Sr., male    DOB: [birth year]  Age: 68 y.o. MRN: [id number]  Chief Complaint  Patient presents with   Follow-up    2 weeks    No new complaints, here for lab review and medication refills. Labs reviewed and notable for well controlled diabetes, A1c remains at target, lipids at target with unremarkable cmp. Denies any hypoglycemic episodes and home bg readings have been at target.     No other concerns at this time.   Past Medical History:  Diagnosis Date   HLD (hyperlipidemia)    Hypertension    Myocardial infarction [facility])     Past Surgical History:  Procedure Laterality Date   CORONARY ANGIOPLASTY WITH STENT PLACEMENT     FEMORAL ARTERY STENT      Social History   Socioeconomic History   Marital status: Single    Spouse name: Not on file   Number of children: Not on file   Years of education: Not on file   Highest education level: Not on file  Occupational History   Not on file  Tobacco Use   Smoking status: Every Day    Average packs/day: 2.0 packs/day for 51.1 years (100.2 ttl pk-yrs)    Types: Cigarettes    Start date: 10/1972    Last attempt to quit: 10/2022    Years since quitting: 1.5   Smokeless tobacco: Never  Vaping Use   Vaping status: Never Used  Substance and Sexual Activity   Alcohol use: Not Currently   Drug use: Not Currently   Sexual activity: Not Currently  Other Topics Concern   Not on file  Social History Narrative   Not on file   Social Drivers of Health   Financial Resource Strain: Not on file  Food Insecurity: Food Insecurity Present (07/23/2019)   Received from [facility]   Hunger Vital Sign    Within the past 12 months, you worried that your food would run out before you got the money to buy more.: Sometimes true    Within the past 12 months, the food you bought just didn't last and you didn't have money to get more.: Sometimes true   Transportation Needs: No Transportation Needs (05/26/2022)   PRAPARE - Administrator, Civil Service (Medical): No    Lack of Transportation (Non-Medical): No  Physical Activity: Insufficiently Active (07/23/2019)   Received from [facility]   Exercise Vital Sign    On average, how many days per week do you engage in moderate to strenuous exercise (like a brisk walk)?: 5 days    On average, how many minutes do you engage in exercise at this level?: 10 min  Stress: Not on file  Social Connections: Not on file  Intimate Partner Violence: Not on file    No family history on file.  No Known Allergies  Outpatient Medications Prior to Visit  Medication Sig   amLODipine  (NORVASC ) 10 MG tablet Take 1 tablet (10 mg total) by mouth every morning.   aspirin  EC 81 MG tablet Take 81 mg by mouth daily. Swallow whole.   atorvastatin  (LIPITOR) 20 MG tablet Take 1 tablet (20 mg total) by mouth every evening.   carvedilol  (COREG ) 12.5 MG tablet TAKE 1 TABLET(12.5 MG) BY MOUTH TWICE DAILY   escitalopram  (LEXAPRO ) 10 MG tablet TAKE 1 TABLET(10 MG) BY MOUTH EVERY MORNING   glucose blood (ONETOUCH ULTRA TEST) test strip  Use  up to three time daily to check sugars   hydrochlorothiazide  (HYDRODIURIL ) 25 MG tablet TAKE 1 TABLET(25 MG) BY MOUTH EVERY MORNING   metFORMIN  (GLUCOPHAGE -XR) 500 MG 24 hr tablet TAKE 1 TABLET BY MOUTH EVERY DAY WITH BREAKFAST   nitroGLYCERIN (NITROSTAT) 0.4 MG SL tablet Place 0.4 mg under the tongue every 5 (five) minutes as needed for chest pain.   OneTouch UltraSoft 2 Lancets MISC 1 Lancet by Does not apply route 2 (two) times daily.   sildenafil (REVATIO) 20 MG tablet Take 20-100 mg by mouth as needed (for sex).   tamsulosin  (FLOMAX ) 0.4 MG CAPS capsule Take 1 capsule (0.4 mg total) by mouth every morning.   traZODone  (DESYREL ) 50 MG tablet Take 1 tablet (50 mg total) by mouth at bedtime as needed for sleep.   No facility-administered medications prior to visit.     Review of Systems  Constitutional:  Positive for weight loss (3 lbs).  HENT: Negative.    Eyes: Negative.   Respiratory: Negative.    Cardiovascular: Negative.   Gastrointestinal: Negative.   Genitourinary: Negative.   Skin: Negative.   Neurological: Negative.   Endo/Heme/Allergies: Negative.        Objective:   BP 110/64   Pulse 68   Temp (!) 97.5 F (36.4 C)   Ht 5' 11 (1.803 m)   Wt 204 lb 9.6 oz (92.8 kg)   SpO2 92%   BMI 28.54 kg/m   Vitals:   04/17/24 1142  BP: 110/64  Pulse: 68  Temp: (!) 97.5 F (36.4 C)  Height: 5' 11 (1.803 m)  Weight: 204 lb 9.6 oz (92.8 kg)  SpO2: 92%  BMI (Calculated): 28.55    Physical Exam Vitals reviewed.  Constitutional:      Appearance: Normal appearance.  HENT:     Head: Normocephalic.     Left Ear: There is no impacted cerumen.     Nose: Nose normal.     Mouth/Throat:     Mouth: Mucous membranes are moist.     Pharynx: No posterior oropharyngeal erythema.  Eyes:     Extraocular Movements: Extraocular movements intact.     Pupils: Pupils are equal, round, and reactive to light.  Cardiovascular:     Rate and Rhythm: Regular rhythm.     Chest Wall: PMI is not displaced.     Pulses: Normal pulses.     Heart sounds: Normal heart sounds. No murmur heard. Pulmonary:     Effort: Pulmonary effort is normal.     Breath sounds: Normal air entry. No rhonchi or rales.  Abdominal:     General: Abdomen is flat. Bowel sounds are normal. There is no distension.     Palpations: Abdomen is soft. There is no hepatomegaly, splenomegaly or mass.     Tenderness: There is no abdominal tenderness. There is no right CVA tenderness or left CVA tenderness.  Musculoskeletal:        General: Normal range of motion.     Cervical back: Normal range of motion and neck supple.     Right lower leg: No edema.     Left lower leg: No edema.  Skin:    General: Skin is warm and dry.  Neurological:     General: No focal deficit present.      Mental Status: He is alert and oriented to person, place, and time.     Cranial Nerves: No cranial nerve deficit.     Motor: No weakness.  Psychiatric:  Mood and Affect: Mood normal.        Behavior: Behavior normal.      Results for orders placed or performed in visit on 04/17/24  POCT CBG (Fasting - Glucose)  Result Value Ref Range   Glucose Fasting, POC 93 70 - 99 mg/dL    Recent Results (from the past 2160 hours)  POCT Glucose (CBG)     Status: Abnormal   Collection Time: 03/28/24  9:46 AM  Result Value Ref Range   POC Glucose 100 (A) 70 - 99 mg/dl  Lipid panel     Status: Abnormal   Collection Time: 04/09/24  8:35 AM  Result Value Ref Range   Cholesterol, Total 122 100 - 199 mg/dL   Triglycerides 793 (H) 0 - 149 mg/dL   HDL 24 (L) >60 mg/dL   VLDL Cholesterol Cal 34 5 - 40 mg/dL   LDL Chol Calc (NIH) 64 0 - 99 mg/dL   Chol/HDL Ratio 5.1 (H) 0.0 - 5.0 ratio    Comment:                                   T. Chol/HDL Ratio                                             Men  Women                               1/2 Avg.Risk  3.4    3.3                                   Avg.Risk  5.0    4.4                                2X Avg.Risk  9.6    7.1                                3X Avg.Risk 23.4   11.0   Hemoglobin A1c     Status: Abnormal   Collection Time: 04/09/24  8:35 AM  Result Value Ref Range   Hgb A1c MFr Bld 6.5 (H) 4.8 - 5.6 %    Comment:          Prediabetes: 5.7 - 6.4          Diabetes: >6.4          Glycemic control for adults with diabetes: <7.0    Est. average glucose Bld gHb Est-mCnc 140 mg/dL  POCT CBG (Fasting - Glucose)     Status: None   Collection Time: 04/17/24 11:50 AM  Result Value Ref Range   Glucose Fasting, POC 93 70 - 99 mg/dL      Assessment & Plan:  As per problem list  Problem List Items Addressed This Visit       Endocrine   Type 2 diabetes mellitus without complication, without long-term current use of insulin (HCC) - Primary    Relevant Orders   POCT CBG (Fasting - Glucose) (Completed)  Other   Mixed hyperlipidemia    Return in about 3 months (around 07/18/2024) for fu with labs prior.   Total time spent: 20 minutes  Sherrill Cinderella Perry, MD  04/17/2024   This document may have been prepared by [facility] Voice Recognition software and as such may include unintentional dictation errors.

## 2024-05-16 ENCOUNTER — Other Ambulatory Visit: Payer: Self-pay | Admitting: Internal Medicine

## 2024-05-16 DIAGNOSIS — G4701 Insomnia due to medical condition: Secondary | ICD-10-CM

## 2024-06-21 ENCOUNTER — Other Ambulatory Visit: Payer: Self-pay | Admitting: Internal Medicine

## 2024-06-21 DIAGNOSIS — I10 Essential (primary) hypertension: Secondary | ICD-10-CM

## 2024-07-04 DIAGNOSIS — E1151 Type 2 diabetes mellitus with diabetic peripheral angiopathy without gangrene: Secondary | ICD-10-CM | POA: Diagnosis not present

## 2024-07-04 DIAGNOSIS — I471 Supraventricular tachycardia, unspecified: Secondary | ICD-10-CM | POA: Diagnosis not present

## 2024-07-04 DIAGNOSIS — F325 Major depressive disorder, single episode, in full remission: Secondary | ICD-10-CM | POA: Diagnosis not present

## 2024-07-04 DIAGNOSIS — E785 Hyperlipidemia, unspecified: Secondary | ICD-10-CM | POA: Diagnosis not present

## 2024-07-04 DIAGNOSIS — E1122 Type 2 diabetes mellitus with diabetic chronic kidney disease: Secondary | ICD-10-CM | POA: Diagnosis not present

## 2024-07-04 DIAGNOSIS — M1990 Unspecified osteoarthritis, unspecified site: Secondary | ICD-10-CM | POA: Diagnosis not present

## 2024-07-04 DIAGNOSIS — I700 Atherosclerosis of aorta: Secondary | ICD-10-CM | POA: Diagnosis not present

## 2024-07-04 DIAGNOSIS — E669 Obesity, unspecified: Secondary | ICD-10-CM | POA: Diagnosis not present

## 2024-07-04 DIAGNOSIS — I719 Aortic aneurysm of unspecified site, without rupture: Secondary | ICD-10-CM | POA: Diagnosis not present

## 2024-07-16 ENCOUNTER — Other Ambulatory Visit

## 2024-07-16 DIAGNOSIS — E782 Mixed hyperlipidemia: Secondary | ICD-10-CM | POA: Diagnosis not present

## 2024-07-16 LAB — HEMOGLOBIN A1C
Est. average glucose Bld gHb Est-mCnc: 137 mg/dL
Hgb A1c MFr Bld: 6.4 % — ABNORMAL HIGH (ref 4.8–5.6)

## 2024-07-17 LAB — COMPREHENSIVE METABOLIC PANEL WITH GFR
ALT: 17 IU/L (ref 0–44)
AST: 20 IU/L (ref 0–40)
Albumin: 4.6 g/dL (ref 3.9–4.9)
Alkaline Phosphatase: 118 IU/L (ref 47–123)
BUN/Creatinine Ratio: 10 (ref 10–24)
BUN: 12 mg/dL (ref 8–27)
Bilirubin Total: 0.9 mg/dL (ref 0.0–1.2)
CO2: 17 mmol/L — ABNORMAL LOW (ref 20–29)
Calcium: 9.5 mg/dL (ref 8.6–10.2)
Chloride: 103 mmol/L (ref 96–106)
Creatinine, Ser: 1.26 mg/dL (ref 0.76–1.27)
Globulin, Total: 2.8 g/dL (ref 1.5–4.5)
Glucose: 85 mg/dL (ref 70–99)
Potassium: 4.4 mmol/L (ref 3.5–5.2)
Sodium: 140 mmol/L (ref 134–144)
Total Protein: 7.4 g/dL (ref 6.0–8.5)
eGFR: 63 mL/min/1.73 (ref >59)

## 2024-07-17 LAB — LIPID PANEL
Chol/HDL Ratio: 4.8 ratio (ref 0.0–5.0)
Cholesterol, Total: 115 mg/dL (ref 100–199)
HDL: 24 mg/dL — ABNORMAL LOW (ref >39)
LDL Chol Calc (NIH): 64 mg/dL (ref 0–99)
Triglycerides: 157 mg/dL — ABNORMAL HIGH (ref 0–149)
VLDL Cholesterol Cal: 27 mg/dL (ref 5–40)

## 2024-07-18 ENCOUNTER — Ambulatory Visit (INDEPENDENT_AMBULATORY_CARE_PROVIDER_SITE_OTHER): Admitting: Internal Medicine

## 2024-07-18 ENCOUNTER — Ambulatory Visit: Payer: Self-pay | Admitting: Internal Medicine

## 2024-07-18 VITALS — BP 130/80 | HR 38 | Temp 97.8°F | Ht 71.0 in | Wt 208.8 lb

## 2024-07-18 DIAGNOSIS — E782 Mixed hyperlipidemia: Secondary | ICD-10-CM

## 2024-07-18 DIAGNOSIS — E119 Type 2 diabetes mellitus without complications: Secondary | ICD-10-CM | POA: Diagnosis not present

## 2024-07-18 DIAGNOSIS — R001 Bradycardia, unspecified: Secondary | ICD-10-CM

## 2024-07-18 LAB — POCT CBG (FASTING - GLUCOSE)-MANUAL ENTRY: Glucose Fasting, POC: 76 mg/dL (ref 70–99)

## 2024-07-18 LAB — POC CREATINE & ALBUMIN,URINE
Albumin/Creatinine Ratio, Urine, POC: <30
Creatinine, POC: 50 mg/dL
Microalbumin Ur, POC: 10 mg/L

## 2024-07-18 NOTE — Progress Notes (Signed)
 Established Patient Office Visit  Subjective:  Patient ID: Craig COURY Sr., male    DOB: 02-16-1956  Age: 68 y.o. MRN: [id number]  Chief Complaint  Patient presents with   Follow-up    3 month follow up, needs ckd    No new complaints, here for lab review and medication refills. Labs reviewed and notable for well controlled diabetes, A1c at target, lipids at target with unremarkable cmp. Denies any hypoglycemic episodes and home bg readings have been at target.     No other concerns at this time.   Past Medical History:  Diagnosis Date   HLD (hyperlipidemia)    Hypertension    Myocardial infarction [facility])     Past Surgical History:  Procedure Laterality Date   CORONARY ANGIOPLASTY WITH STENT PLACEMENT     FEMORAL ARTERY STENT      Social History   Socioeconomic History   Marital status: Single    Spouse name: Not on file   Number of children: Not on file   Years of education: Not on file   Highest education level: Not on file  Occupational History   Not on file  Tobacco Use   Smoking status: Every Day    Average packs/day: 2.0 packs/day for 51.1 years (100.2 ttl pk-yrs)    Types: Cigarettes    Start date: 10/1972    Last attempt to quit: 10/2022    Years since quitting: 1.7   Smokeless tobacco: Never  Vaping Use   Vaping status: Never Used  Substance and Sexual Activity   Alcohol use: Not Currently   Drug use: Not Currently   Sexual activity: Not Currently  Other Topics Concern   Not on file  Social History Narrative   Not on file   Social Drivers of Health   Financial Resource Strain: Not on file  Food Insecurity: Food Insecurity Present (07/23/2019)   Received from [facility]   Hunger Vital Sign    Within the past 12 months, you worried that your food would run out before you got the money to buy more.: Sometimes true    Within the past 12 months, the food you bought just didn't last and you didn't have money to get more.: Sometimes true   Transportation Needs: No Transportation Needs (05/26/2022)   PRAPARE - Administrator, Civil Service (Medical): No    Lack of Transportation (Non-Medical): No  Physical Activity: Insufficiently Active (07/23/2019)   Received from [facility]   Exercise Vital Sign    On average, how many days per week do you engage in moderate to strenuous exercise (like a brisk walk)?: 5 days    On average, how many minutes do you engage in exercise at this level?: 10 min  Stress: Not on file  Social Connections: Not on file  Intimate Partner Violence: Not on file    No family history on file.  No Known Allergies  Outpatient Medications Prior to Visit  Medication Sig   amLODipine  (NORVASC ) 10 MG tablet TAKE 1 TABLET(10 MG) BY MOUTH EVERY MORNING   aspirin  EC 81 MG tablet Take 81 mg by mouth daily. Swallow whole.   atorvastatin  (LIPITOR) 20 MG tablet Take 1 tablet (20 mg total) by mouth every evening.   carvedilol  (COREG ) 12.5 MG tablet TAKE 1 TABLET(12.5 MG) BY MOUTH TWICE DAILY   escitalopram  (LEXAPRO ) 10 MG tablet TAKE 1 TABLET(10 MG) BY MOUTH EVERY MORNING   glucose blood (ONETOUCH ULTRA TEST) test  strip Use  up to three time daily to check sugars   hydrochlorothiazide  (HYDRODIURIL ) 25 MG tablet TAKE 1 TABLET(25 MG) BY MOUTH EVERY MORNING   metFORMIN  (GLUCOPHAGE -XR) 500 MG 24 hr tablet TAKE 1 TABLET BY MOUTH EVERY DAY WITH BREAKFAST   OneTouch UltraSoft 2 Lancets MISC 1 Lancet by Does not apply route 2 (two) times daily.   sildenafil (REVATIO) 20 MG tablet Take 20-100 mg by mouth as needed (for sex).   tamsulosin  (FLOMAX ) 0.4 MG CAPS capsule Take 1 capsule (0.4 mg total) by mouth every morning.   traZODone  (DESYREL ) 50 MG tablet TAKE 1 TABLET BY MOUTH AT BEDTIME AS NEEDED FOR SLEEP   nitroGLYCERIN (NITROSTAT) 0.4 MG SL tablet Place 0.4 mg under the tongue every 5 (five) minutes as needed for chest pain. (Patient not taking: Reported on 07/18/2024)   No facility-administered  medications prior to visit.    Review of Systems  Constitutional:  Negative for weight loss (gained 4 lbs).  HENT: Negative.    Eyes: Negative.   Respiratory: Negative.    Cardiovascular: Negative.   Gastrointestinal: Negative.   Genitourinary: Negative.   Skin: Negative.   Neurological: Negative.   Endo/Heme/Allergies: Negative.        Objective:   BP 130/80   Pulse (!) 38   Temp 97.8 F (36.6 C)   Ht 5' 11 (1.803 m)   Wt 208 lb 12.8 oz (94.7 kg)   SpO2 95%   BMI 29.12 kg/m   Vitals:   07/18/24 0945  BP: 130/80  Pulse: (!) 38  Temp: 97.8 F (36.6 C)  Height: 5' 11 (1.803 m)  Weight: 208 lb 12.8 oz (94.7 kg)  SpO2: 95%  BMI (Calculated): 29.13    Physical Exam Vitals reviewed.  Constitutional:      Appearance: Normal appearance.  HENT:     Head: Normocephalic.     Left Ear: There is no impacted cerumen.     Nose: Nose normal.     Mouth/Throat:     Mouth: Mucous membranes are moist.     Pharynx: No posterior oropharyngeal erythema.  Eyes:     Extraocular Movements: Extraocular movements intact.     Pupils: Pupils are equal, round, and reactive to light.  Cardiovascular:     Rate and Rhythm: Regular rhythm.     Chest Wall: PMI is not displaced.     Pulses: Normal pulses.     Heart sounds: Normal heart sounds. No murmur heard. Pulmonary:     Effort: Pulmonary effort is normal.     Breath sounds: Normal air entry. No rhonchi or rales.  Abdominal:     General: Abdomen is flat. Bowel sounds are normal. There is no distension.     Palpations: Abdomen is soft. There is no hepatomegaly, splenomegaly or mass.     Tenderness: There is no abdominal tenderness. There is no right CVA tenderness or left CVA tenderness.  Musculoskeletal:        General: Normal range of motion.     Cervical back: Normal range of motion and neck supple.     Right lower leg: No edema.     Left lower leg: No edema.  Skin:    General: Skin is warm and dry.  Neurological:      General: No focal deficit present.     Mental Status: He is alert and oriented to person, place, and time.     Cranial Nerves: No cranial nerve deficit.     Motor:  No weakness.  Psychiatric:        Mood and Affect: Mood normal.        Behavior: Behavior normal.      Results for orders placed or performed in visit on 07/18/24  POCT CBG (Fasting - Glucose)  Result Value Ref Range   Glucose Fasting, POC 76 70 - 99 mg/dL  POC CREATINE & ALBUMIN,URINE  Result Value Ref Range   Microalbumin Ur, POC 10 mg/L   Creatinine, POC 50 mg/dL   Albumin/Creatinine Ratio, Urine, POC <30     Recent Results (from the past 2160 hours)  Lipid panel     Status: Abnormal   Collection Time: 07/16/24 10:46 AM  Result Value Ref Range   Cholesterol, Total 115 100 - 199 mg/dL   Triglycerides 842 (H) 0 - 149 mg/dL   HDL 24 (L) >60 mg/dL   VLDL Cholesterol Cal 27 5 - 40 mg/dL   LDL Chol Calc (NIH) 64 0 - 99 mg/dL   Chol/HDL Ratio 4.8 0.0 - 5.0 ratio    Comment:                                   T. Chol/HDL Ratio                                             Men  Women                               1/2 Avg.Risk  3.4    3.3                                   Avg.Risk  5.0    4.4                                2X Avg.Risk  9.6    7.1                                3X Avg.Risk 23.4   11.0   Comprehensive metabolic panel with GFR     Status: Abnormal   Collection Time: 07/16/24 10:46 AM  Result Value Ref Range   Glucose 85 70 - 99 mg/dL   BUN 12 8 - 27 mg/dL   Creatinine, Ser 8.73 0.76 - 1.27 mg/dL   eGFR 63 >40 fO/fpw/8.26   BUN/Creatinine Ratio 10 10 - 24   Sodium 140 134 - 144 mmol/L   Potassium 4.4 3.5 - 5.2 mmol/L   Chloride 103 96 - 106 mmol/L   CO2 17 (L) 20 - 29 mmol/L   Calcium  9.5 8.6 - 10.2 mg/dL   Total Protein 7.4 6.0 - 8.5 g/dL   Albumin 4.6 3.9 - 4.9 g/dL   Globulin, Total 2.8 1.5 - 4.5 g/dL   Bilirubin Total 0.9 0.0 - 1.2 mg/dL   Alkaline Phosphatase 118 47 - 123 IU/L   AST 20 0 -  40 IU/L   ALT 17 0 - 44 IU/L  Hemoglobin A1c     Status: Abnormal   Collection  Time: 07/16/24 10:47 AM  Result Value Ref Range   Hgb A1c MFr Bld 6.4 (H) 4.8 - 5.6 %    Comment:          Prediabetes: 5.7 - 6.4          Diabetes: >6.4          Glycemic control for adults with diabetes: <7.0    Est. average glucose Bld gHb Est-mCnc 137 mg/dL  POCT CBG (Fasting - Glucose)     Status: Normal   Collection Time: 07/18/24  9:47 AM  Result Value Ref Range   Glucose Fasting, POC 76 70 - 99 mg/dL  POC CREATINE & ALBUMIN,URINE     Status: Normal   Collection Time: 07/18/24  9:56 AM  Result Value Ref Range   Microalbumin Ur, POC 10 mg/L   Creatinine, POC 50 mg/dL   Albumin/Creatinine Ratio, Urine, POC <30       Assessment & Plan:  Jules was seen today for follow-up.  Type 2 diabetes mellitus without complication, without long-term current use of insulin (HCC) -     POCT CBG (Fasting - Glucose) -     POC CREATINE & ALBUMIN,URINE -     Hemoglobin A1c  Bradycardia  Mixed hyperlipidemia -     Lipid panel   12 lead EKG-sinus arrhythmia Problem List Items Addressed This Visit       Endocrine   Type 2 diabetes mellitus without complication, without long-term current use of insulin (HCC) - Primary   Relevant Orders   POCT CBG (Fasting - Glucose) (Completed)   POC CREATINE & ALBUMIN,URINE (Completed)     Other   Mixed hyperlipidemia   Other Visit Diagnoses       Bradycardia           Return in about 3 months (around 10/18/2024) for fu with labs prior.   Total time spent: 20 minutes  Sherrill Cinderella Perry, MD  07/18/2024   This document may have been prepared by [facility] Voice Recognition software and as such may include unintentional dictation errors.

## 2024-07-27 ENCOUNTER — Encounter: Payer: Self-pay | Admitting: Cardiovascular Disease

## 2024-09-03 ENCOUNTER — Other Ambulatory Visit: Payer: Self-pay | Admitting: Internal Medicine

## 2024-09-03 DIAGNOSIS — N401 Enlarged prostate with lower urinary tract symptoms: Secondary | ICD-10-CM

## 2024-09-19 ENCOUNTER — Ambulatory Visit: Admitting: Internal Medicine

## 2024-09-24 ENCOUNTER — Other Ambulatory Visit: Payer: Self-pay | Admitting: Internal Medicine

## 2024-09-24 DIAGNOSIS — I10 Essential (primary) hypertension: Secondary | ICD-10-CM

## 2024-09-24 DIAGNOSIS — F3289 Other specified depressive episodes: Secondary | ICD-10-CM

## 2024-09-25 ENCOUNTER — Other Ambulatory Visit

## 2024-09-26 LAB — LIPID PANEL
Chol/HDL Ratio: 5.1 ratio — ABNORMAL HIGH (ref 0.0–5.0)
Cholesterol, Total: 142 mg/dL (ref 100–199)
HDL: 28 mg/dL — ABNORMAL LOW
LDL Chol Calc (NIH): 87 mg/dL (ref 0–99)
Triglycerides: 155 mg/dL — ABNORMAL HIGH (ref 0–149)
VLDL Cholesterol Cal: 27 mg/dL (ref 5–40)

## 2024-09-26 LAB — HEMOGLOBIN A1C
Est. average glucose Bld gHb Est-mCnc: 137 mg/dL
Hgb A1c MFr Bld: 6.4 % — ABNORMAL HIGH (ref 4.8–5.6)

## 2024-09-28 ENCOUNTER — Encounter: Payer: Self-pay | Admitting: Internal Medicine

## 2024-09-28 ENCOUNTER — Ambulatory Visit (INDEPENDENT_AMBULATORY_CARE_PROVIDER_SITE_OTHER): Admitting: Internal Medicine

## 2024-09-28 ENCOUNTER — Ambulatory Visit: Payer: Self-pay | Admitting: Internal Medicine

## 2024-09-28 VITALS — BP 125/81 | HR 45 | Temp 98.3°F | Ht 71.0 in | Wt 207.0 lb

## 2024-09-28 DIAGNOSIS — E119 Type 2 diabetes mellitus without complications: Secondary | ICD-10-CM

## 2024-09-28 DIAGNOSIS — N401 Enlarged prostate with lower urinary tract symptoms: Secondary | ICD-10-CM

## 2024-09-28 DIAGNOSIS — I10 Essential (primary) hypertension: Secondary | ICD-10-CM | POA: Diagnosis not present

## 2024-09-28 DIAGNOSIS — G4701 Insomnia due to medical condition: Secondary | ICD-10-CM | POA: Diagnosis not present

## 2024-09-28 DIAGNOSIS — Z1211 Encounter for screening for malignant neoplasm of colon: Secondary | ICD-10-CM | POA: Diagnosis not present

## 2024-09-28 DIAGNOSIS — R351 Nocturia: Secondary | ICD-10-CM | POA: Diagnosis not present

## 2024-09-28 DIAGNOSIS — E782 Mixed hyperlipidemia: Secondary | ICD-10-CM

## 2024-09-28 DIAGNOSIS — N1831 Chronic kidney disease, stage 3a: Secondary | ICD-10-CM

## 2024-09-28 DIAGNOSIS — E1165 Type 2 diabetes mellitus with hyperglycemia: Secondary | ICD-10-CM

## 2024-09-28 LAB — POCT CBG (FASTING - GLUCOSE)-MANUAL ENTRY: Glucose Fasting, POC: 112 mg/dL — AB (ref 70–99)

## 2024-09-28 MED ORDER — TRAZODONE HCL 150 MG PO TABS: 150.0000 mg | ORAL_TABLET | Freq: Every day | ORAL | 0 refills | Status: AC

## 2024-09-28 MED ORDER — EMPAGLIFLOZIN 10 MG PO TABS: 10.0000 mg | ORAL_TABLET | Freq: Every day | ORAL | 0 refills | Status: DC

## 2024-09-28 MED ORDER — METFORMIN HCL ER 500 MG PO TB24: 500.0000 mg | ORAL_TABLET | Freq: Every day | ORAL | 1 refills | Status: AC

## 2024-09-28 MED ORDER — HYDROCHLOROTHIAZIDE 25 MG PO TABS: 25.0000 mg | ORAL_TABLET | Freq: Every day | ORAL | 0 refills | Status: AC

## 2024-09-28 NOTE — Progress Notes (Signed)
 "  Established Patient Office Visit  Subjective:  Patient ID: Craig DEMEO Sr., male    DOB: 30-Jul-1956  Age: 68 y.o. MRN: [id number]  Chief Complaint  Patient presents with   Follow-up    2 month follow up    No new complaints, here for lab review and medication refills. Labs reviewed and notable for well controlled/uncontrolled diabetes, A1c at target, lipids at target although higher. Denies any hypoglycemic episodes and home bg readings have been at target.     No other concerns at this time.   Past Medical History:  Diagnosis Date   HLD (hyperlipidemia)    Hypertension    Myocardial infarction [facility])     Past Surgical History:  Procedure Laterality Date   CORONARY ANGIOPLASTY WITH STENT PLACEMENT     FEMORAL ARTERY STENT      Social History   Socioeconomic History   Marital status: Single    Spouse name: Not on file   Number of children: Not on file   Years of education: Not on file   Highest education level: Not on file  Occupational History   Not on file  Tobacco Use   Smoking status: Every Day    Average packs/day: 2.0 packs/day for 51.1 years (100.2 ttl pk-yrs)    Types: Cigarettes    Start date: 10/1972    Last attempt to quit: 10/2022    Years since quitting: 1.9   Smokeless tobacco: Never  Vaping Use   Vaping status: Never Used  Substance and Sexual Activity   Alcohol use: Not Currently   Drug use: Not Currently   Sexual activity: Not Currently  Other Topics Concern   Not on file  Social History Narrative   Not on file   Social Drivers of Health   Tobacco Use: High Risk (09/28/2024)   Patient History    Smoking Tobacco Use: Every Day    Smokeless Tobacco Use: Never    Passive Exposure: Not on file  Financial Resource Strain: Not on file  Food Insecurity: Not on file  Transportation Needs: No Transportation Needs (05/26/2022)   PRAPARE - Transportation    Lack of Transportation (Medical): No    Lack of Transportation (Non-Medical): No   Physical Activity: Not on file  Stress: Not on file  Social Connections: Not on file  Intimate Partner Violence: Not on file  Depression (PHQ2-9): Low Risk (12/06/2023)   Depression (PHQ2-9)    PHQ-2 Score: 0  Alcohol Screen: Not on file  Housing: Low Risk (05/26/2022)   Housing    Last Housing Risk Score: 0  Utilities: Not on file  Health Literacy: Not on file    No family history on file.  Allergies[1]  Show/hide medication list[2]  Review of Systems  Constitutional:  Positive for weight loss (1 lb).  HENT: Negative.    Eyes: Negative.   Respiratory: Negative.    Cardiovascular: Negative.   Gastrointestinal: Negative.   Genitourinary: Negative.   Skin: Negative.   Neurological: Negative.   Endo/Heme/Allergies: Negative.        Objective:   BP 125/81   Pulse (!) 45   Temp 98.3 F (36.8 C)   Ht 5' 11 (1.803 m)   Wt 207 lb (93.9 kg)   SpO2 93%   BMI 28.87 kg/m   Vitals:   09/28/24 1047  BP: 125/81  Pulse: (!) 45  Temp: 98.3 F (36.8 C)  Height: 5' 11 (1.803 m)  Weight: 207 lb (93.9 kg)  SpO2: 93%  BMI (Calculated): 28.88    Physical Exam Vitals reviewed.  Constitutional:      Appearance: Normal appearance.  HENT:     Head: Normocephalic.     Left Ear: There is no impacted cerumen.     Nose: Nose normal.     Mouth/Throat:     Mouth: Mucous membranes are moist.     Pharynx: No posterior oropharyngeal erythema.  Eyes:     Extraocular Movements: Extraocular movements intact.     Pupils: Pupils are equal, round, and reactive to light.  Cardiovascular:     Rate and Rhythm: Regular rhythm.     Chest Wall: PMI is not displaced.     Pulses: Normal pulses.     Heart sounds: Normal heart sounds. No murmur heard. Pulmonary:     Effort: Pulmonary effort is normal.     Breath sounds: Normal air entry. No rhonchi or rales.  Abdominal:     General: Abdomen is flat. Bowel sounds are normal. There is no distension.     Palpations: Abdomen is soft.  There is no hepatomegaly, splenomegaly or mass.     Tenderness: There is no abdominal tenderness. There is no right CVA tenderness or left CVA tenderness.  Musculoskeletal:        General: Normal range of motion.     Cervical back: Normal range of motion and neck supple.     Right lower leg: No edema.     Left lower leg: No edema.  Skin:    General: Skin is warm and dry.  Neurological:     General: No focal deficit present.     Mental Status: He is alert and oriented to person, place, and time.     Cranial Nerves: No cranial nerve deficit.     Motor: No weakness.  Psychiatric:        Mood and Affect: Mood normal.        Behavior: Behavior normal.      Results for orders placed or performed in visit on 09/28/24  POCT CBG (Fasting - Glucose)  Result Value Ref Range   Glucose Fasting, POC 112 (A) 70 - 99 mg/dL    Recent Results (from the past 2160 hours)  Lipid panel     Status: Abnormal   Collection Time: 07/16/24 10:46 AM  Result Value Ref Range   Cholesterol, Total 115 100 - 199 mg/dL   Triglycerides 842 (H) 0 - 149 mg/dL   HDL 24 (L) >60 mg/dL   VLDL Cholesterol Cal 27 5 - 40 mg/dL   LDL Chol Calc (NIH) 64 0 - 99 mg/dL   Chol/HDL Ratio 4.8 0.0 - 5.0 ratio    Comment:                                   T. Chol/HDL Ratio                                             Men  Women                               1/2 Avg.Risk  3.4    3.3  Avg.Risk  5.0    4.4                                2X Avg.Risk  9.6    7.1                                3X Avg.Risk 23.4   11.0   Comprehensive metabolic panel with GFR     Status: Abnormal   Collection Time: 07/16/24 10:46 AM  Result Value Ref Range   Glucose 85 70 - 99 mg/dL   BUN 12 8 - 27 mg/dL   Creatinine, Ser 8.73 0.76 - 1.27 mg/dL   eGFR 63 >40 fO/fpw/8.26   BUN/Creatinine Ratio 10 10 - 24   Sodium 140 134 - 144 mmol/L   Potassium 4.4 3.5 - 5.2 mmol/L   Chloride 103 96 - 106 mmol/L   CO2 17 (L)  20 - 29 mmol/L   Calcium  9.5 8.6 - 10.2 mg/dL   Total Protein 7.4 6.0 - 8.5 g/dL   Albumin 4.6 3.9 - 4.9 g/dL   Globulin, Total 2.8 1.5 - 4.5 g/dL   Bilirubin Total 0.9 0.0 - 1.2 mg/dL   Alkaline Phosphatase 118 47 - 123 IU/L   AST 20 0 - 40 IU/L   ALT 17 0 - 44 IU/L  Hemoglobin A1c     Status: Abnormal   Collection Time: 07/16/24 10:47 AM  Result Value Ref Range   Hgb A1c MFr Bld 6.4 (H) 4.8 - 5.6 %    Comment:          Prediabetes: 5.7 - 6.4          Diabetes: >6.4          Glycemic control for adults with diabetes: <7.0    Est. average glucose Bld gHb Est-mCnc 137 mg/dL  POCT CBG (Fasting - Glucose)     Status: Normal   Collection Time: 07/18/24  9:47 AM  Result Value Ref Range   Glucose Fasting, POC 76 70 - 99 mg/dL  POC CREATINE & ALBUMIN,URINE     Status: Normal   Collection Time: 07/18/24  9:56 AM  Result Value Ref Range   Microalbumin Ur, POC 10 mg/L   Creatinine, POC 50 mg/dL   Albumin/Creatinine Ratio, Urine, POC <30   Hemoglobin A1c     Status: Abnormal   Collection Time: 09/25/24  1:57 PM  Result Value Ref Range   Hgb A1c MFr Bld 6.4 (H) 4.8 - 5.6 %    Comment:          Prediabetes: 5.7 - 6.4          Diabetes: >6.4          Glycemic control for adults with diabetes: <7.0    Est. average glucose Bld gHb Est-mCnc 137 mg/dL  Lipid panel     Status: Abnormal   Collection Time: 09/25/24  1:58 PM  Result Value Ref Range   Cholesterol, Total 142 100 - 199 mg/dL   Triglycerides 844 (H) 0 - 149 mg/dL   HDL 28 (L) >60 mg/dL   VLDL Cholesterol Cal 27 5 - 40 mg/dL   LDL Chol Calc (NIH) 87 0 - 99 mg/dL   Chol/HDL Ratio 5.1 (H) 0.0 - 5.0 ratio    Comment:  T. Chol/HDL Ratio                                             Men  Women                               1/2 Avg.Risk  3.4    3.3                                   Avg.Risk  5.0    4.4                                2X Avg.Risk  9.6    7.1                                3X Avg.Risk  23.4   11.0   POCT CBG (Fasting - Glucose)     Status: Abnormal   Collection Time: 09/28/24 10:55 AM  Result Value Ref Range   Glucose Fasting, POC 112 (A) 70 - 99 mg/dL      Assessment & Plan:  Idan was seen today for follow-up.  Type 2 diabetes mellitus without complication, without long-term current use of insulin (HCC) -     POCT CBG (Fasting - Glucose) -     Empagliflozin ; Take 1 tablet (10 mg total) by mouth daily before breakfast.  Dispense: 90 tablet; Refill: 0 -     metFORMIN  HCl ER; Take 1 tablet (500 mg total) by mouth daily with breakfast.  Dispense: 90 tablet; Refill: 1 -     Ambulatory referral to Ophthalmology -     Hemoglobin A1c  Insomnia due to medical condition -     traZODone  HCl; Take 1 tablet (150 mg total) by mouth at bedtime.  Dispense: 90 tablet; Refill: 0  Essential hypertension -     hydroCHLOROthiazide ; Take 1 tablet (25 mg total) by mouth daily.  Dispense: 90 tablet; Refill: 0 -     VITAMIN D 25 Hydroxy (Vit-D Deficiency, Fractures)  CKD stage 3a, GFR 45-59 ml/min (HCC) -     Comprehensive metabolic panel with GFR  Mixed hyperlipidemia -     Lipid panel -     Lipid panel  Colon cancer screening -     Ambulatory referral to Gastroenterology  BPH associated with nocturia -     PSA    Problem List Items Addressed This Visit       Cardiovascular and Mediastinum   Essential hypertension   Relevant Medications   hydrochlorothiazide  (HYDRODIURIL ) 25 MG tablet   Other Relevant Orders   Vitamin D (25 hydroxy)     Endocrine   Type 2 diabetes mellitus without complication, without long-term current use of insulin (HCC) - Primary   Relevant Medications   empagliflozin  (JARDIANCE ) 10 MG TABS tablet   metFORMIN  (GLUCOPHAGE -XR) 500 MG 24 hr tablet   Other Relevant Orders   POCT CBG (Fasting - Glucose) (Completed)   Ambulatory referral to Ophthalmology     Genitourinary   CKD stage 3a, GFR 45-59 ml/min (HCC)   Relevant Orders    Comprehensive metabolic panel with  GFR     Other   Mixed hyperlipidemia   Relevant Medications   hydrochlorothiazide  (HYDRODIURIL ) 25 MG tablet   Other Relevant Orders   Lipid panel   Other Visit Diagnoses       Insomnia due to medical condition       Relevant Medications   traZODone  (DESYREL ) 150 MG tablet     Colon cancer screening       Relevant Orders   Ambulatory referral to Gastroenterology     BPH associated with nocturia       Relevant Orders   PSA       Return in about 3 months (around 12/27/2024) for awv with labs prior.   Total time spent: 20 minutes. This time includes review of previous notes and results and patient face to face interaction during today'Mattilynn Forrer visit.    Sherrill Cinderella Perry, MD  09/28/2024   This document may have been prepared by [facility] Voice Recognition software and as such may include unintentional dictation errors.     [1] No Known Allergies [2]  Outpatient Medications Prior to Visit  Medication Sig   amLODipine  (NORVASC ) 10 MG tablet TAKE 1 TABLET(10 MG) BY MOUTH EVERY MORNING   aspirin  EC 81 MG tablet Take 81 mg by mouth daily. Swallow whole.   atorvastatin  (LIPITOR) 20 MG tablet Take 1 tablet (20 mg total) by mouth every evening.   carvedilol  (COREG ) 12.5 MG tablet TAKE 1 TABLET(12.5 MG) BY MOUTH TWICE DAILY   escitalopram  (LEXAPRO ) 10 MG tablet TAKE 1 TABLET(10 MG) BY MOUTH EVERY MORNING   glucose blood (ONETOUCH ULTRA TEST) test strip Use  up to three time daily to check sugars   OneTouch UltraSoft 2 Lancets MISC 1 Lancet by Does not apply route 2 (two) times daily.   sildenafil (REVATIO) 20 MG tablet Take 20-100 mg by mouth as needed (for sex).   tamsulosin  (FLOMAX ) 0.4 MG CAPS capsule TAKE 1 CAPSULE(0.4 MG) BY MOUTH EVERY MORNING   [DISCONTINUED] hydrochlorothiazide  (HYDRODIURIL ) 25 MG tablet TAKE 1 TABLET(25 MG) BY MOUTH EVERY MORNING   [DISCONTINUED] metFORMIN  (GLUCOPHAGE -XR) 500 MG 24 hr tablet TAKE 1 TABLET BY MOUTH EVERY DAY  WITH BREAKFAST   [DISCONTINUED] traZODone  (DESYREL ) 50 MG tablet TAKE 1 TABLET BY MOUTH AT BEDTIME AS NEEDED FOR SLEEP   nitroGLYCERIN (NITROSTAT) 0.4 MG SL tablet Place 0.4 mg under the tongue every 5 (five) minutes as needed for chest pain. (Patient not taking: Reported on 09/28/2024)   No facility-administered medications prior to visit.   "

## 2024-10-02 ENCOUNTER — Telehealth: Payer: Self-pay

## 2024-10-02 NOTE — Telephone Encounter (Signed)
 Pt called to state his Jardiance  is $1400 and is requesting an alternative.

## 2024-10-09 DIAGNOSIS — E119 Type 2 diabetes mellitus without complications: Secondary | ICD-10-CM

## 2024-10-09 MED ORDER — EMPAGLIFLOZIN 10 MG PO TABS: 10.0000 mg | ORAL_TABLET | Freq: Every day | ORAL | 0 refills | Status: AC

## 2024-10-09 NOTE — Telephone Encounter (Signed)
 Resubmitted and asked for PA to be sent if needed

## 2024-12-28 ENCOUNTER — Ambulatory Visit: Admitting: Internal Medicine
